# Patient Record
Sex: Female | Born: 1985 | Race: Black or African American | Hispanic: No | Marital: Married | Smoking: Current every day smoker
Health system: Southern US, Community
[De-identification: ages and names within clinical notes are randomized; demographics above are authoritative.]

## PROBLEM LIST (undated history)

## (undated) DIAGNOSIS — B999 Unspecified infectious disease: Secondary | ICD-10-CM

## (undated) DIAGNOSIS — G40309 Generalized idiopathic epilepsy and epileptic syndromes, not intractable, without status epilepticus: Secondary | ICD-10-CM

## (undated) DIAGNOSIS — N62 Hypertrophy of breast: Secondary | ICD-10-CM

## (undated) DIAGNOSIS — G43909 Migraine, unspecified, not intractable, without status migrainosus: Secondary | ICD-10-CM

## (undated) DIAGNOSIS — R51 Headache: Secondary | ICD-10-CM

## (undated) DIAGNOSIS — O009 Unspecified ectopic pregnancy without intrauterine pregnancy: Secondary | ICD-10-CM

## (undated) DIAGNOSIS — G40409 Other generalized epilepsy and epileptic syndromes, not intractable, without status epilepticus: Secondary | ICD-10-CM

## (undated) DIAGNOSIS — B86 Scabies: Secondary | ICD-10-CM

## (undated) HISTORY — PX: THERAPEUTIC ABORTION: SHX798

---
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
## 2004-05-13 ENCOUNTER — Emergency Department (HOSPITAL_COMMUNITY): Admission: EM | Admit: 2004-05-13 | Discharge: 2004-05-14 | Payer: Self-pay | Admitting: Emergency Medicine

## 2005-12-24 ENCOUNTER — Emergency Department (HOSPITAL_COMMUNITY): Admission: EM | Admit: 2005-12-24 | Discharge: 2005-12-24 | Payer: Self-pay | Admitting: Emergency Medicine

## 2006-08-05 ENCOUNTER — Emergency Department (HOSPITAL_COMMUNITY): Admission: EM | Admit: 2006-08-05 | Discharge: 2006-08-05 | Payer: Self-pay | Admitting: Emergency Medicine

## 2006-09-09 ENCOUNTER — Emergency Department (HOSPITAL_COMMUNITY): Admission: EM | Admit: 2006-09-09 | Discharge: 2006-09-09 | Payer: Self-pay | Admitting: Emergency Medicine

## 2006-12-11 ENCOUNTER — Emergency Department (HOSPITAL_COMMUNITY): Admission: EM | Admit: 2006-12-11 | Discharge: 2006-12-11 | Payer: Self-pay | Admitting: Emergency Medicine

## 2006-12-26 ENCOUNTER — Emergency Department (HOSPITAL_COMMUNITY): Admission: EM | Admit: 2006-12-26 | Discharge: 2006-12-26 | Payer: Self-pay | Admitting: Emergency Medicine

## 2007-11-25 ENCOUNTER — Emergency Department (HOSPITAL_COMMUNITY): Admission: EM | Admit: 2007-11-25 | Discharge: 2007-11-25 | Payer: Self-pay | Admitting: Emergency Medicine

## 2008-01-02 ENCOUNTER — Emergency Department (HOSPITAL_COMMUNITY): Admission: EM | Admit: 2008-01-02 | Discharge: 2008-01-02 | Payer: Self-pay | Admitting: Emergency Medicine

## 2008-04-06 ENCOUNTER — Emergency Department (HOSPITAL_COMMUNITY): Admission: EM | Admit: 2008-04-06 | Discharge: 2008-04-06 | Payer: Self-pay | Admitting: Emergency Medicine

## 2008-05-27 ENCOUNTER — Emergency Department (HOSPITAL_COMMUNITY): Admission: EM | Admit: 2008-05-27 | Discharge: 2008-05-27 | Payer: Self-pay | Admitting: Emergency Medicine

## 2008-07-26 ENCOUNTER — Emergency Department (HOSPITAL_COMMUNITY): Admission: EM | Admit: 2008-07-26 | Discharge: 2008-07-26 | Payer: Self-pay | Admitting: Emergency Medicine

## 2008-07-29 ENCOUNTER — Other Ambulatory Visit: Admission: RE | Admit: 2008-07-29 | Discharge: 2008-07-29 | Payer: Self-pay | Admitting: Obstetrics & Gynecology

## 2008-08-08 ENCOUNTER — Emergency Department (HOSPITAL_COMMUNITY): Admission: EM | Admit: 2008-08-08 | Discharge: 2008-08-08 | Payer: Self-pay | Admitting: Emergency Medicine

## 2008-10-07 ENCOUNTER — Emergency Department (HOSPITAL_COMMUNITY): Admission: EM | Admit: 2008-10-07 | Discharge: 2008-10-07 | Payer: Self-pay | Admitting: Emergency Medicine

## 2009-03-05 ENCOUNTER — Emergency Department (HOSPITAL_COMMUNITY): Admission: EM | Admit: 2009-03-05 | Discharge: 2009-03-05 | Payer: Self-pay | Admitting: Emergency Medicine

## 2009-04-01 ENCOUNTER — Emergency Department (HOSPITAL_COMMUNITY): Admission: EM | Admit: 2009-04-01 | Discharge: 2009-04-01 | Payer: Self-pay | Admitting: Emergency Medicine

## 2009-07-29 ENCOUNTER — Emergency Department (HOSPITAL_COMMUNITY): Admission: EM | Admit: 2009-07-29 | Discharge: 2009-07-30 | Payer: Self-pay | Admitting: Emergency Medicine

## 2009-08-19 ENCOUNTER — Other Ambulatory Visit: Admission: RE | Admit: 2009-08-19 | Discharge: 2009-08-19 | Payer: Self-pay | Admitting: Obstetrics & Gynecology

## 2009-09-04 ENCOUNTER — Emergency Department (HOSPITAL_COMMUNITY): Admission: EM | Admit: 2009-09-04 | Discharge: 2009-09-04 | Payer: Self-pay | Admitting: Emergency Medicine

## 2009-09-18 ENCOUNTER — Ambulatory Visit: Admission: RE | Admit: 2009-09-18 | Discharge: 2009-09-18 | Payer: Self-pay | Admitting: Neurology

## 2009-11-22 ENCOUNTER — Emergency Department (HOSPITAL_COMMUNITY): Admission: EM | Admit: 2009-11-22 | Discharge: 2009-11-22 | Payer: Self-pay | Admitting: Emergency Medicine

## 2010-08-04 ENCOUNTER — Emergency Department (HOSPITAL_COMMUNITY)
Admission: EM | Admit: 2010-08-04 | Discharge: 2010-08-04 | Disposition: A | Discharge status: Home / Self Care | Payer: Self-pay | Attending: Emergency Medicine | Admitting: Emergency Medicine

## 2010-08-04 DIAGNOSIS — B86 Scabies: Secondary | ICD-10-CM | POA: Insufficient documentation

## 2010-08-25 LAB — URINALYSIS, ROUTINE W REFLEX MICROSCOPIC
Glucose, UA: NEGATIVE mg/dL
Nitrite: NEGATIVE
Specific Gravity, Urine: 1.025 (ref 1.005–1.030)
pH: 5.5 (ref 5.0–8.0)

## 2010-08-25 LAB — DIFFERENTIAL
Eosinophils Absolute: 0 10*3/uL (ref 0.0–0.7)
Eosinophils Relative: 0 % (ref 0–5)
Lymphocytes Relative: 10 % — ABNORMAL LOW (ref 12–46)
Lymphs Abs: 1.9 10*3/uL (ref 0.7–4.0)
Monocytes Absolute: 1.3 10*3/uL — ABNORMAL HIGH (ref 0.1–1.0)

## 2010-08-25 LAB — COMPREHENSIVE METABOLIC PANEL
AST: 16 U/L (ref 0–37)
Alkaline Phosphatase: 57 U/L (ref 39–117)
BUN: 8 mg/dL (ref 6–23)
CO2: 22 mEq/L (ref 19–32)
Calcium: 8.3 mg/dL — ABNORMAL LOW (ref 8.4–10.5)
Chloride: 108 mEq/L (ref 96–112)
Creatinine, Ser: 0.93 mg/dL (ref 0.4–1.2)
Potassium: 3.3 mEq/L — ABNORMAL LOW (ref 3.5–5.1)
Total Bilirubin: 0.5 mg/dL (ref 0.3–1.2)
Total Protein: 6.4 g/dL (ref 6.0–8.3)

## 2010-08-25 LAB — URINE MICROSCOPIC-ADD ON

## 2010-08-25 LAB — CBC
HCT: 33 % — ABNORMAL LOW (ref 36.0–46.0)
Hemoglobin: 11.8 g/dL — ABNORMAL LOW (ref 12.0–15.0)
RBC: 3.65 MIL/uL — ABNORMAL LOW (ref 3.87–5.11)

## 2010-08-25 LAB — PREGNANCY, URINE: Preg Test, Ur: NEGATIVE

## 2010-08-25 LAB — URINE CULTURE: Culture: NO GROWTH

## 2010-08-25 LAB — PHENYTOIN LEVEL, TOTAL: Phenytoin Lvl: <2.5 ug/mL — ABNORMAL LOW (ref 10.0–20.0)

## 2010-08-25 LAB — RAPID STREP SCREEN (MED CTR MEBANE ONLY): Streptococcus, Group A Screen (Direct): NEGATIVE

## 2010-09-09 LAB — BASIC METABOLIC PANEL
Chloride: 107 mEq/L (ref 96–112)
Creatinine, Ser: 1.13 mg/dL (ref 0.4–1.2)
GFR calc non Af Amer: 59 mL/min — ABNORMAL LOW (ref >60)
Glucose, Bld: 89 mg/dL (ref 70–99)
Potassium: 3.7 mEq/L (ref 3.5–5.1)
Sodium: 137 mEq/L (ref 135–145)

## 2010-09-09 LAB — PREGNANCY, URINE: Preg Test, Ur: NEGATIVE

## 2010-09-09 LAB — DIFFERENTIAL
Basophils Absolute: 0 10*3/uL (ref 0.0–0.1)
Basophils Relative: 0 % (ref 0–1)
Lymphs Abs: 2.3 10*3/uL (ref 0.7–4.0)
Monocytes Absolute: 0.7 10*3/uL (ref 0.1–1.0)
Neutro Abs: 11.8 10*3/uL — ABNORMAL HIGH (ref 1.7–7.7)
Neutrophils Relative %: 80 % — ABNORMAL HIGH (ref 43–77)

## 2010-09-09 LAB — CBC
HCT: 38.5 % (ref 36.0–46.0)
MCHC: 34.6 g/dL (ref 30.0–36.0)
MCV: 91.4 fL (ref 78.0–100.0)
Platelets: 257 10*3/uL (ref 150–400)
RDW: 13.5 % (ref 11.5–15.5)
WBC: 14.8 10*3/uL — ABNORMAL HIGH (ref 4.0–10.5)

## 2010-09-14 LAB — URINALYSIS, ROUTINE W REFLEX MICROSCOPIC
Bilirubin Urine: NEGATIVE
Ketones, ur: NEGATIVE mg/dL
Nitrite: NEGATIVE
Specific Gravity, Urine: 1.03 (ref 1.005–1.030)
pH: 7 (ref 5.0–8.0)

## 2010-09-14 LAB — DIFFERENTIAL
Basophils Relative: 0 % (ref 0–1)
Eosinophils Absolute: 0.2 10*3/uL (ref 0.0–0.7)
Eosinophils Relative: 1 % (ref 0–5)
Monocytes Absolute: 0.9 10*3/uL (ref 0.1–1.0)
Neutro Abs: 8.8 10*3/uL — ABNORMAL HIGH (ref 1.7–7.7)

## 2010-09-14 LAB — BASIC METABOLIC PANEL
CO2: 15 mEq/L — ABNORMAL LOW (ref 19–32)
Chloride: 106 mEq/L (ref 96–112)
Glucose, Bld: 101 mg/dL — ABNORMAL HIGH (ref 70–99)
Potassium: 3.4 mEq/L — ABNORMAL LOW (ref 3.5–5.1)
Sodium: 136 mEq/L (ref 135–145)

## 2010-09-14 LAB — RAPID URINE DRUG SCREEN, HOSP PERFORMED
Amphetamines: NOT DETECTED
Barbiturates: NOT DETECTED
Benzodiazepines: NOT DETECTED
Cocaine: NOT DETECTED

## 2010-09-14 LAB — CBC
HCT: 38.3 % (ref 36.0–46.0)
Hemoglobin: 13.1 g/dL (ref 12.0–15.0)
MCHC: 34.3 g/dL (ref 30.0–36.0)
MCV: 93 fL (ref 78.0–100.0)
RDW: 13.5 % (ref 11.5–15.5)

## 2010-09-21 LAB — URINALYSIS, ROUTINE W REFLEX MICROSCOPIC
Bilirubin Urine: NEGATIVE
Nitrite: NEGATIVE
Specific Gravity, Urine: >1.03 — ABNORMAL HIGH (ref 1.005–1.030)
pH: 5 (ref 5.0–8.0)

## 2010-09-21 LAB — URINE MICROSCOPIC-ADD ON

## 2010-09-21 LAB — GC/CHLAMYDIA PROBE AMP, GENITAL: GC Probe Amp, Genital: NEGATIVE

## 2010-09-21 LAB — WET PREP, GENITAL

## 2010-09-21 LAB — RPR: RPR Ser Ql: NONREACTIVE

## 2010-10-22 NOTE — Op Note (Signed)
NAMECHARNETTE, YOUNKIN                        ACCOUNT NO.:  [id number]   MEDICAL RECORD NO.:  [id number]                   PATIENT TYPE:  INP   LOCATION:  A415                                 FACILITY:  [facility]   PHYSICIAN:  Lazaro Arms, M.D.                DATE OF BIRTH:  July 15, 1985   DATE OF PROCEDURE:  DATE OF DISCHARGE:  05/08/2003                                 OPERATIVE REPORT   PROCEDURE:  Epidural placement.   INDICATIONS FOR PROCEDURE:  Chantille is an 25 year old female, gravida 1,  para 0 who was in the active phase of labor on 05/06/2003.  She was  requesting an epidural.   DESCRIPTION OF PROCEDURE:  The patient was placed in the sitting position.  The iliac crests were identified.  The L2-L3 interspace was identified,  prepped and draped.  Lidocaine 1% was injected subcutaneously as a local  anesthetic.  A 17-gauge Tuohy needle was used an loss of resistance  technique employed.  With one pass, the epidural space was found without  difficulty.  A test dose of 10 cc of 0.125% bupivacaine plain was given  without ill effects.  The epidural catheter was then fed 5 cm into the  epidural space.  An additional 10 cc of 0.125% bupivacaine plain with 5 cc  of 1.5% lidocaine with 1:200,000 epinephrine was given through the epidural  catheter, again without ill effects.  It was taped down at the skin 5 cm  into the epidural space and anchored without difficulty.  The patient's  blood pressure was stable, the fetal heart rate tracing was stable, and the  patient was getting good relief.      ___________________________________________                                            Lazaro Arms, M.D.   LHE/MEDQ  D:  05/27/2003  T:  05/27/2003  Job:  [id number]

## 2010-10-22 NOTE — H&P (Signed)
Katherine Simmons, [name]                        ACCOUNT NO.:  [id number]   MEDICAL RECORD NO.:  [id number]                  PATIENT TYPE:   LOCATION:                                       FACILITY:   PHYSICIAN:  Lazaro Arms, M.D.                DATE OF BIRTH:   DATE OF ADMISSION:  05/06/2003  DATE OF DISCHARGE:                                HISTORY & PHYSICAL   CHIEF COMPLAINT:  Intrauterine pregnancy at 54 weeks' gestation with  borderline reactive non-stress test for induction of labor.   HISTORY OF PRESENT ILLNESS:  Katherine Simmons is an 25 year old, G2, P0, AB1 with an  EDC of May 06, 2003, based on last menstrual period and correlating  first and second trimester ultrasound placing her at 26 weeks' gestation.  She began prenatal care early in the first trimester and has had regular  visits throughout.   PRENATAL COURSE:  Prenatal care has been uneventful.   PRENATAL LABORATORY DATA:  Blood type O positive, rubella immune.  HB, HIV,  RPR, gonorrhea and Chlamydia were all negative.  Group B Streptococcus was  positive.  One-hour GTT was normal at 107.  Sickle cell screen was negative.  MSAFP was normal.  Blood pressures have been 100-120/50-80.  Total weight  gain has been 21 pounds with appropriate fundal height growth.   PAST MEDICAL HISTORY:  Noncontributory.   PAST SURGICAL HISTORY:  Noncontributory.   ALLERGIES:  No known drug allergies.   SOCIAL HISTORY:  Single and lives with parents.  Father of the baby is  supported.   FAMILY HISTORY:  No contributing family history.   PAST OBSTETRICAL HISTORY:  Positive for elective AB in 2001.   PHYSICAL EXAMINATION:  HEENT:  Within normal limits.  HEART:  Regular rate and rhythm.  LUNGS:  Clear.  ABDOMEN:  Soft, nontender with mild, irregular contractions at this time.  Fetal heart rate is in the 130s with average long-term variability.  She  does have some qualifying 15 x 15 accelerations; however, most of the strip  is  without qualifying accelerations.  There has been one variable  deceleration.  PELVIC:  Artificial rupture of membranes reveal light meconium stained  fluid.  Cervix is 2-3 cm, 80% effaced, -1 station, vertex presentation.  EXTREMITIES:  Legs are negative.   IMPRESSION:  1. Intrauterine pregnancy at 40 weeks.  2. Borderline reactive nonstress test.   PLAN:  1. Admit for induction of labor.  GBS carrier.  2. Pitocin augmentation.  3. GBS prophylaxis.  4. The patient does plan an epidural.     _____________________________________  ___________________________________________  Katherine Simmons, C.N.M.        Lazaro Arms, M.D.   FC/MEDQ  D:  05/06/2003  T:  05/06/2003  Job:  [id number]   cc:   [facility]

## 2010-12-12 ENCOUNTER — Emergency Department (HOSPITAL_COMMUNITY)
Admission: EM | Admit: 2010-12-12 | Discharge: 2010-12-12 | Disposition: A | Discharge status: Home / Self Care | Payer: Medicaid Other | Attending: Emergency Medicine | Admitting: Emergency Medicine

## 2010-12-12 ENCOUNTER — Encounter: Payer: Self-pay | Admitting: *Deleted

## 2010-12-12 DIAGNOSIS — B86 Scabies: Secondary | ICD-10-CM | POA: Insufficient documentation

## 2010-12-12 DIAGNOSIS — L259 Unspecified contact dermatitis, unspecified cause: Secondary | ICD-10-CM | POA: Insufficient documentation

## 2010-12-12 DIAGNOSIS — F17200 Nicotine dependence, unspecified, uncomplicated: Secondary | ICD-10-CM | POA: Insufficient documentation

## 2010-12-12 DIAGNOSIS — R569 Unspecified convulsions: Secondary | ICD-10-CM | POA: Insufficient documentation

## 2010-12-12 HISTORY — DX: Scabies: B86

## 2010-12-12 MED ORDER — TRIAMCINOLONE ACETONIDE 0.1 % EX CREA
TOPICAL_CREAM | Freq: Three times a day (TID) | CUTANEOUS | Status: AC
Start: 2010-12-12 — End: 2011-12-12

## 2010-12-12 NOTE — ED Notes (Signed)
Rash to right side of face, right wrist and bil hands x 3 days ago.  C/o itching and burning with bathing.  Has been dx with Scabies in March of 2012 and used left over cream with no relief.

## 2010-12-12 NOTE — ED Provider Notes (Signed)
History     Chief Complaint  Patient presents with  . Rash    to right side of face, bil arms, bil hands   Patient is a 25 y.o. female presenting with rash. The history is provided by the patient.  Rash  This is a new problem. The current episode started more than 2 days ago. The problem has not changed since onset.The problem is associated with nothing. There has been no fever. The rash is present on the right wrist, right hand, left hand and face. The patient is experiencing no pain. The pain has been constant since onset. Associated symptoms include itching. Pertinent negatives include no blisters, no pain and no weeping. She has tried nothing for the symptoms.    Past Medical History  Diagnosis Date  . Scabies   . Seizure     History reviewed. No pertinent past surgical history.  Family History  Problem Relation Age of Onset  . Hypertension Mother   . Cancer Mother     History  Substance Use Topics  . Smoking status: Current Everyday Smoker -- 0.5 packs/day    Types: Cigarettes  . Smokeless tobacco: Not on file  . Alcohol Use: Yes     occasional    OB History    Grav Para Term Preterm Abortions TAB SAB Ect Mult Living   2 1              Review of Systems  Constitutional: Negative for fever and chills.  HENT: Negative for ear pain, congestion, trouble swallowing and neck pain.   Respiratory: Negative.   Cardiovascular: Negative.   Gastrointestinal: Negative for nausea, vomiting and abdominal pain.  Genitourinary: Negative for dysuria.  Musculoskeletal: Negative.   Skin: Positive for itching and rash. Negative for color change.  Neurological: Negative.   Hematological: Does not bruise/bleed easily.    Physical Exam  BP 105/73  Pulse 86  Temp(Src) 98.3 F (36.8 C) (Oral)  Resp 20  Ht 5\' 6"  (1.676 m)  Wt 260 lb (117.935 kg)  BMI 41.97 kg/m2  SpO2 100%  LMP 11/28/2010  Physical Exam  Constitutional: She is oriented to person, place, and time. Vital  signs are normal. She appears well-developed and well-nourished.  Non-toxic appearance.  HENT:  Head: Normocephalic and atraumatic.  Eyes: Conjunctivae are normal. Pupils are equal, round, and reactive to light.  Cardiovascular: Normal rate, regular rhythm and normal heart sounds.   Pulmonary/Chest: Effort normal and breath sounds normal.  Abdominal: Soft.  Musculoskeletal: Normal range of motion.  Lymphadenopathy:    She has no cervical adenopathy.  Neurological: She is alert and oriented to person, place, and time.  Skin: Skin is warm and dry. Rash noted. No purpura noted. Rash is macular and papular. Rash is not vesicular and not urticarial.    ED Course  Procedures  MDM  Patient is alert, NAD.  Vitals stable      Tammy L. Trisha Mangle, [place] 12/12/10 1248  Evaluation and management were performed by NP/PA under my supervision/collaboration.  SHELDON,CHARLES B.   Charles B. Bernette Mayers, MD 12/14/10 [phone]

## 2011-03-04 LAB — RAPID URINE DRUG SCREEN, HOSP PERFORMED
Amphetamines: NOT DETECTED
Barbiturates: NOT DETECTED
Benzodiazepines: NOT DETECTED
Cocaine: NOT DETECTED
Opiates: NOT DETECTED
Tetrahydrocannabinol: NOT DETECTED

## 2011-03-04 LAB — URINALYSIS, ROUTINE W REFLEX MICROSCOPIC
Bilirubin Urine: NEGATIVE
Hgb urine dipstick: NEGATIVE
Ketones, ur: NEGATIVE
Nitrite: NEGATIVE
Protein, ur: NEGATIVE
Specific Gravity, Urine: >1.03 — ABNORMAL HIGH
Urobilinogen, UA: 0.2

## 2011-03-04 LAB — DIFFERENTIAL
Eosinophils Absolute: 0
Lymphocytes Relative: 31
Lymphs Abs: 4.5 — ABNORMAL HIGH
Monocytes Relative: 6
Neutro Abs: 9 — ABNORMAL HIGH
Neutrophils Relative %: 62

## 2011-03-04 LAB — ETHANOL: Alcohol, Ethyl (B): <5

## 2011-03-04 LAB — POCT I-STAT, CHEM 8
Calcium, Ion: 1.21
Creatinine, Ser: 1
Glucose, Bld: 76
HCT: 39
Hemoglobin: 13.3
Potassium: 3.8

## 2011-03-04 LAB — CBC
MCV: 91.7
Platelets: 267
RBC: 4.12
WBC: 14.5 — ABNORMAL HIGH

## 2011-03-21 LAB — URINALYSIS, ROUTINE W REFLEX MICROSCOPIC
Bilirubin Urine: NEGATIVE
Ketones, ur: NEGATIVE
Specific Gravity, Urine: >1.03 — ABNORMAL HIGH
Urobilinogen, UA: 0.2

## 2011-03-21 LAB — URINE MICROSCOPIC-ADD ON

## 2011-03-21 LAB — URINE CULTURE
Colony Count: NO GROWTH
Culture: NO GROWTH

## 2011-03-22 LAB — URINE MICROSCOPIC-ADD ON

## 2011-03-22 LAB — URINALYSIS, ROUTINE W REFLEX MICROSCOPIC
Bilirubin Urine: NEGATIVE
Glucose, UA: NEGATIVE
Specific Gravity, Urine: >1.03 — ABNORMAL HIGH

## 2011-03-22 LAB — GC/CHLAMYDIA PROBE AMP, GENITAL: GC Probe Amp, Genital: NEGATIVE

## 2011-06-07 HISTORY — PX: LAPAROSCOPIC UNILATERAL SALPINGECTOMY: SHX5934

## 2011-12-28 IMAGING — CT CT HEAD W/O CM
3 of 6 series · 12 of 47 positions shown, 14 images · non-contrast
Comparison: CT head 10/07/2008.  MRI brain 01/02/2008.

CT HEAD

CLINICAL DATA: Seizure.  Hit posterior head and counter.  Pain.

CT HEAD WITHOUT CONTRAST
CT CERVICAL SPINE WITHOUT CONTRAST
TECHNIQUE: Multidetector CT imaging of the head and cervical spine
was performed following the standard protocol without intravenous
contrast.  Multiplanar CT image reconstructions of the cervical
spine were also generated.

[Series 3: headseq 2.4 h60s · axial · 0.47mm/px · z∈[+201,+330]mm · 6 of 72 slices shown, 8 images]
[im 11/72  brain]
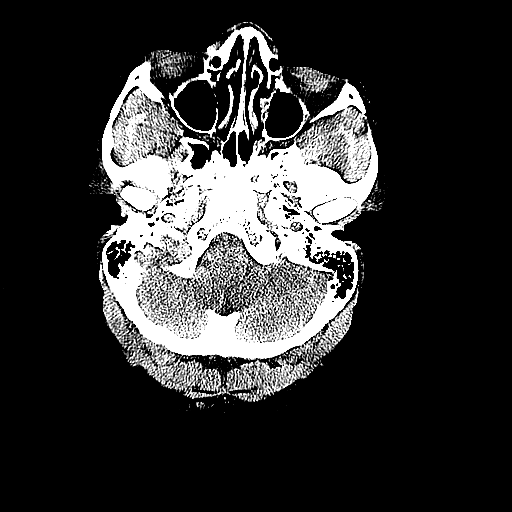
[im 11/72  bone]
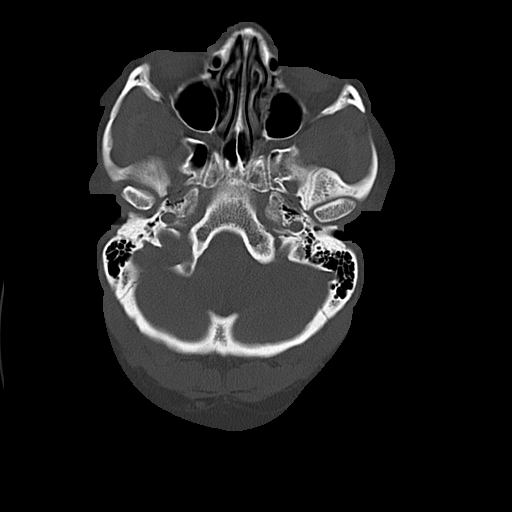
[im 21/72  brain]
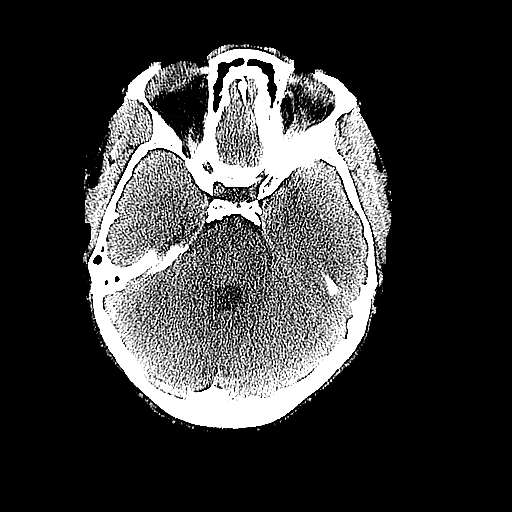
[im 31/72  brain]
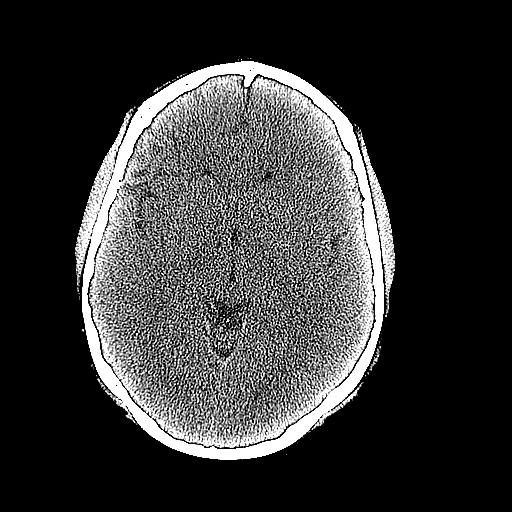
[im 41/72  brain]
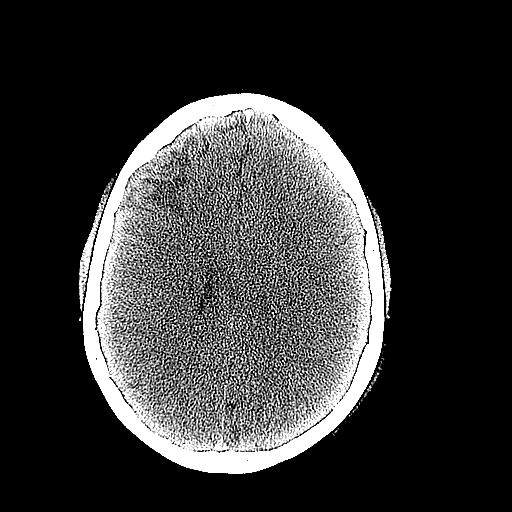
[im 51/72  brain]
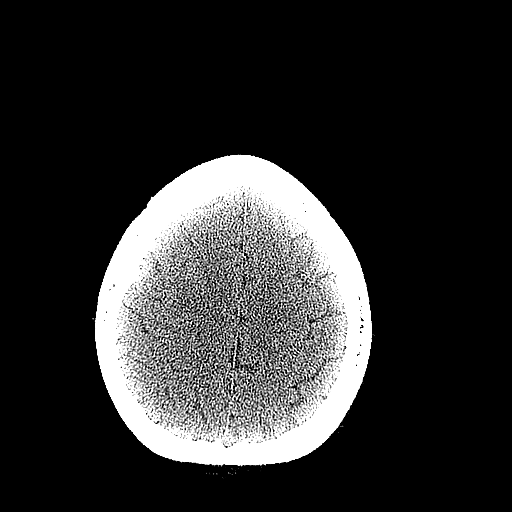
[im 51/72  bone]
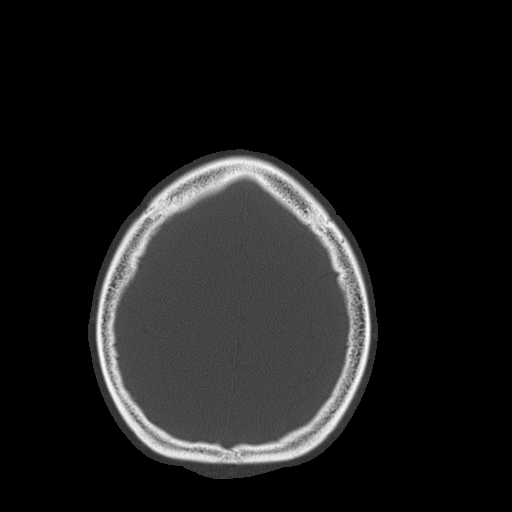
[im 61/72  brain]
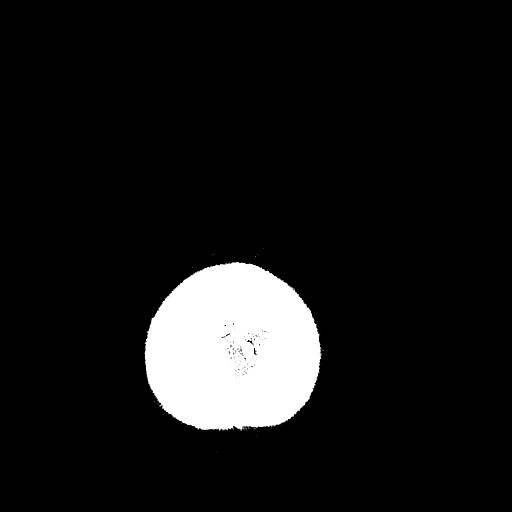

[Series 7: cervical coro (id) · coronal · 0.14mm/px · 3 of 35 slices shown]
[im 12/35  brain]
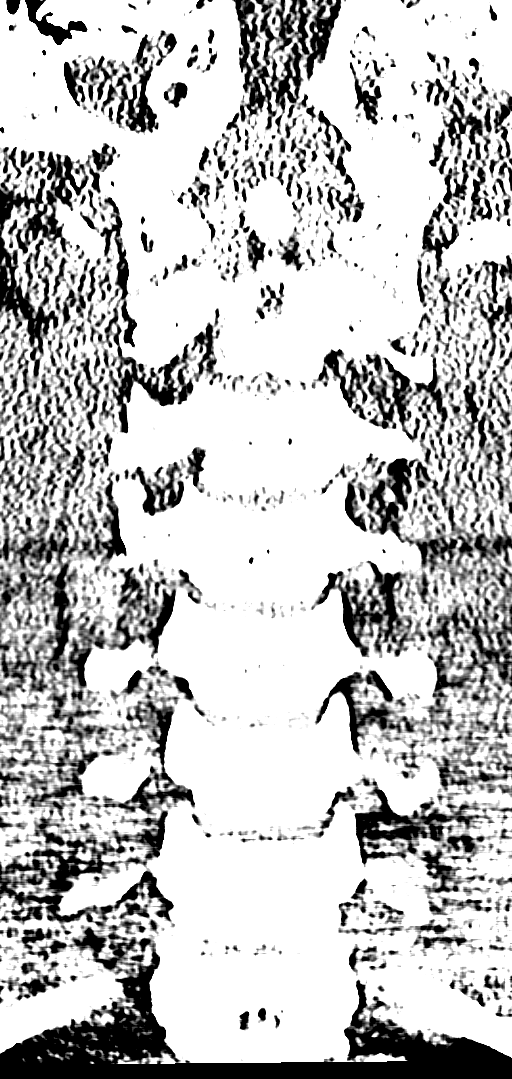
[im 16/35  brain]
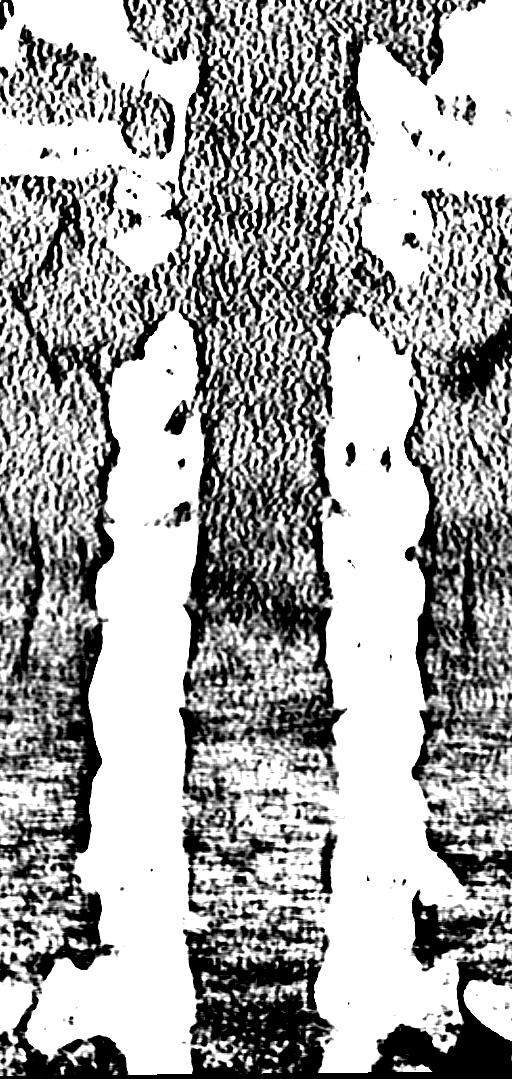
[im 19/35  brain]
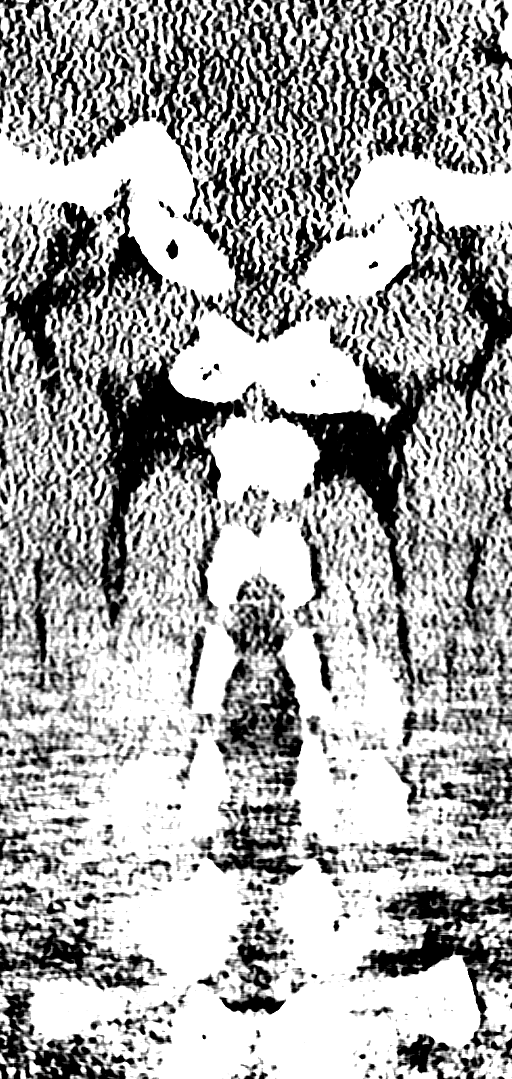

[Series 8: cervical sag (id) · sagittal · 0.17mm/px · 3 of 37 slices shown]
[im 13/37  brain]
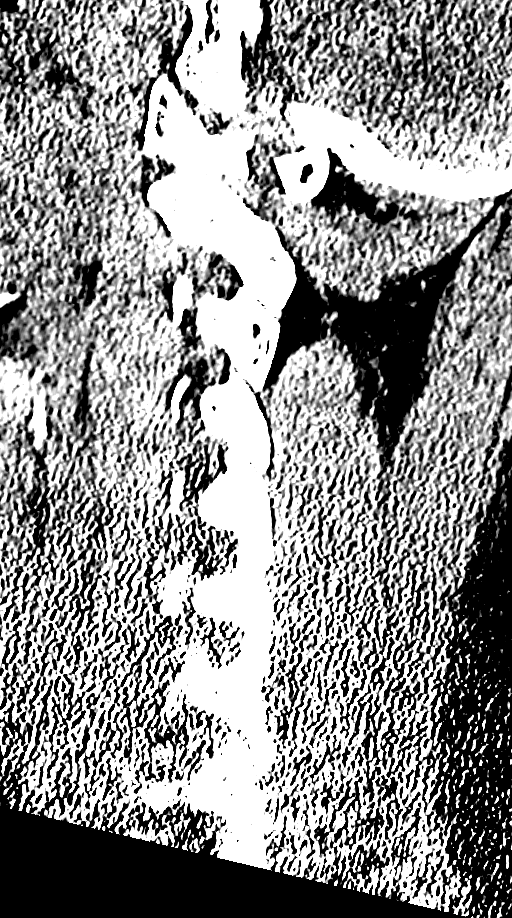
[im 19/37  brain]
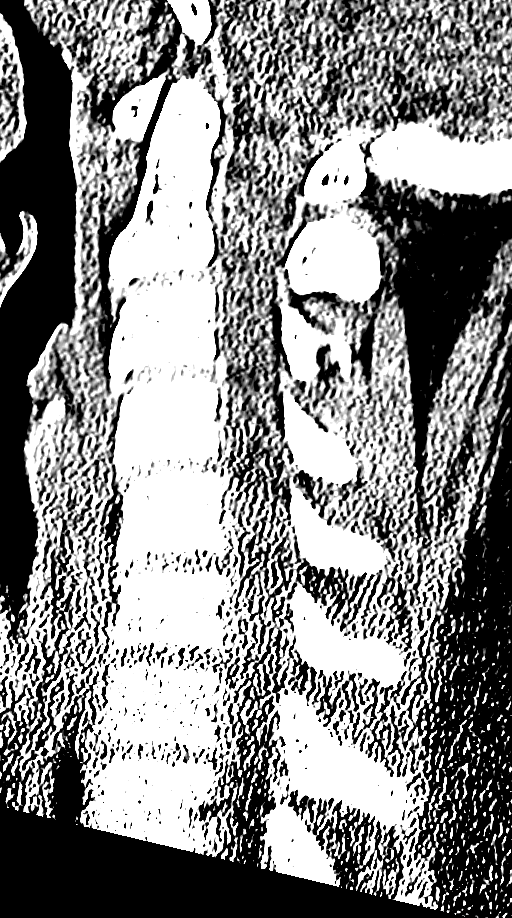
[im 25/37  brain]
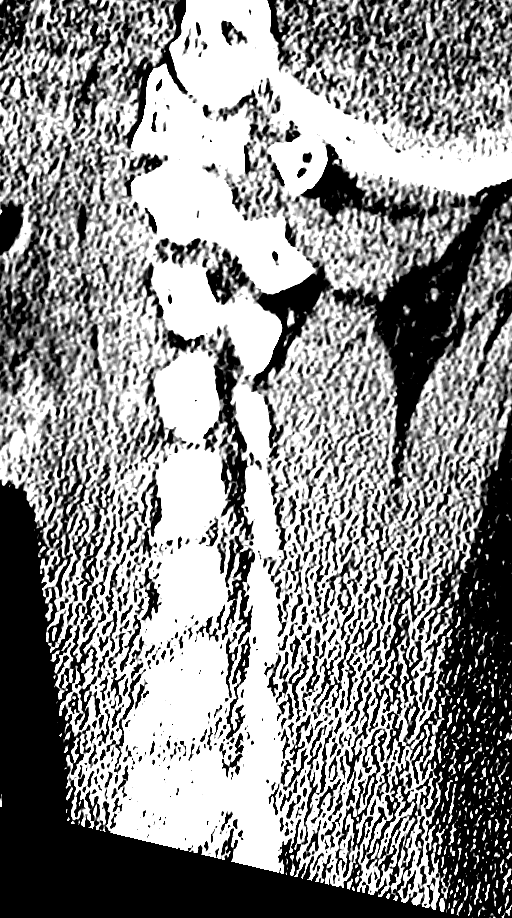

[12 of 47 positions shown; findings below may reference images not displayed]

FINDINGS: A focal area of encephalomalacia is again seen in the
right frontal lobe, extending to the frontal horn of the right
lateral ventricle.  The no acute cortical infarct, hemorrhage,
mass, hydrocephalus, or significant extra-axial fluid collection is
present.

The paranasal sinuses and mastoid air cells are clear.  The osseous
skull is intact.
IMPRESSION: 1.  Stable encephalomalacia/gliosis of the right frontal lobe.
2.  No acute intracranial abnormality.

CT CERVICAL SPINE
FINDINGS: The cervical spine is imaged from skull base through C7-
T1.  The vertebral body heights and alignment are maintained.
There is straightening of the normal cervical lordosis.  No acute
fracture or traumatic subluxation is evident.  The soft tissues are
unremarkable.  The lung apices are not imaged.
IMPRESSION: No acute fracture or traumatic subluxation.

## 2012-02-17 ENCOUNTER — Encounter (HOSPITAL_COMMUNITY): Payer: Self-pay | Admitting: Anesthesiology

## 2012-02-17 ENCOUNTER — Ambulatory Visit (HOSPITAL_COMMUNITY): Payer: Medicaid Other | Admitting: Anesthesiology

## 2012-02-17 ENCOUNTER — Ambulatory Visit (HOSPITAL_COMMUNITY)
Admission: RE | Admit: 2012-02-17 | Discharge: 2012-02-17 | Disposition: A | Discharge status: Home / Self Care | Payer: Medicaid Other | Source: Ambulatory Visit | Attending: Obstetrics and Gynecology | Admitting: Obstetrics and Gynecology

## 2012-02-17 ENCOUNTER — Encounter: Payer: Self-pay | Admitting: Obstetrics and Gynecology

## 2012-02-17 ENCOUNTER — Encounter (HOSPITAL_COMMUNITY)
Admission: RE | Disposition: A | Discharge status: Home / Self Care | Payer: Self-pay | Source: Ambulatory Visit | Attending: Obstetrics and Gynecology

## 2012-02-17 DIAGNOSIS — Z8759 Personal history of other complications of pregnancy, childbirth and the puerperium: Secondary | ICD-10-CM

## 2012-02-17 DIAGNOSIS — O00109 Unspecified tubal pregnancy without intrauterine pregnancy: Secondary | ICD-10-CM | POA: Diagnosis present

## 2012-02-17 SURGERY — SALPINGECTOMY, UNILATERAL, LAPAROSCOPIC
Anesthesia: General | Laterality: Right | Wound class: Clean

## 2012-02-17 MED ORDER — GLYCOPYRROLATE 0.2 MG/ML IJ SOLN
INTRAMUSCULAR | Status: AC
  Filled 2012-02-17: qty 3

## 2012-02-17 MED ORDER — SUCCINYLCHOLINE CHLORIDE 20 MG/ML IJ SOLN
INTRAMUSCULAR | Status: AC
  Filled 2012-02-17: qty 1

## 2012-02-17 MED ORDER — FENTANYL CITRATE 0.05 MG/ML IJ SOLN
INTRAMUSCULAR | Status: AC
  Filled 2012-02-17: qty 5

## 2012-02-17 MED ORDER — FENTANYL CITRATE 0.05 MG/ML IJ SOLN
INTRAMUSCULAR | Status: DC | PRN
  Administered 2012-02-17 (×7): 50 ug via INTRAVENOUS

## 2012-02-17 MED ORDER — SUCCINYLCHOLINE CHLORIDE 20 MG/ML IJ SOLN
INTRAMUSCULAR | Status: DC | PRN
  Administered 2012-02-17: 120 mg via INTRAVENOUS

## 2012-02-17 MED ORDER — ONDANSETRON HCL 4 MG/2ML IJ SOLN
INTRAMUSCULAR | Status: AC
  Filled 2012-02-17: qty 2

## 2012-02-17 MED ORDER — FENTANYL CITRATE 0.05 MG/ML IJ SOLN
INTRAMUSCULAR | Status: AC
  Filled 2012-02-17: qty 2

## 2012-02-17 MED ORDER — OXYCODONE-ACETAMINOPHEN 10-325 MG PO TABS: 1.0000 | ORAL_TABLET | ORAL | Status: AC | PRN

## 2012-02-17 MED ORDER — CEFAZOLIN SODIUM-DEXTROSE 2-3 GM-% IV SOLR: 2.0000 g | INTRAVENOUS | Status: DC

## 2012-02-17 MED ORDER — LIDOCAINE HCL 1 % IJ SOLN
INTRAMUSCULAR | Status: DC | PRN
  Administered 2012-02-17: 40 mg via INTRADERMAL

## 2012-02-17 MED ORDER — CEFAZOLIN SODIUM 1-5 GM-% IV SOLN
1.0000 g | INTRAVENOUS | Status: AC
  Administered 2012-02-17: 1 g via INTRAVENOUS

## 2012-02-17 MED ORDER — LACTATED RINGERS IV SOLN
INTRAVENOUS | Status: DC
  Administered 2012-02-17 (×2): via INTRAVENOUS

## 2012-02-17 MED ORDER — ROCURONIUM BROMIDE 100 MG/10ML IV SOLN
INTRAVENOUS | Status: DC | PRN
  Administered 2012-02-17: 25 mg via INTRAVENOUS
  Administered 2012-02-17: 5 mg via INTRAVENOUS

## 2012-02-17 MED ORDER — SODIUM CHLORIDE 0.9 % IR SOLN
Status: DC | PRN
  Administered 2012-02-17: 1000 mL

## 2012-02-17 MED ORDER — MIDAZOLAM HCL 5 MG/5ML IJ SOLN
INTRAMUSCULAR | Status: DC | PRN
  Administered 2012-02-17 (×2): 2 mg via INTRAVENOUS

## 2012-02-17 MED ORDER — MIDAZOLAM HCL 2 MG/2ML IJ SOLN
INTRAMUSCULAR | Status: AC
  Filled 2012-02-17: qty 2

## 2012-02-17 MED ORDER — CEFAZOLIN SODIUM 1-5 GM-% IV SOLN
INTRAVENOUS | Status: AC
  Filled 2012-02-17: qty 150

## 2012-02-17 MED ORDER — PROPOFOL 10 MG/ML IV BOLUS
INTRAVENOUS | Status: DC | PRN
  Administered 2012-02-17: 175 mg via INTRAVENOUS

## 2012-02-17 MED ORDER — GLYCOPYRROLATE 0.2 MG/ML IJ SOLN
INTRAMUSCULAR | Status: DC | PRN
  Administered 2012-02-17: .6 mg via INTRAVENOUS

## 2012-02-17 MED ORDER — FENTANYL CITRATE 0.05 MG/ML IJ SOLN
25.0000 ug | INTRAMUSCULAR | Status: DC | PRN
  Administered 2012-02-17 (×2): 50 ug via INTRAVENOUS

## 2012-02-17 MED ORDER — PROPOFOL 10 MG/ML IV EMUL
INTRAVENOUS | Status: AC
  Filled 2012-02-17: qty 20

## 2012-02-17 MED ORDER — NEOSTIGMINE METHYLSULFATE 1 MG/ML IJ SOLN
INTRAMUSCULAR | Status: DC | PRN
  Administered 2012-02-17: 3 mg via INTRAVENOUS

## 2012-02-17 MED ORDER — LIDOCAINE HCL (PF) 1 % IJ SOLN
INTRAMUSCULAR | Status: AC
  Filled 2012-02-17: qty 5

## 2012-02-17 MED ORDER — ROCURONIUM BROMIDE 50 MG/5ML IV SOLN
INTRAVENOUS | Status: AC
  Filled 2012-02-17: qty 1

## 2012-02-17 MED ORDER — DEXTROSE 5 % IV SOLN: 3.0000 g | INTRAVENOUS | Status: DC

## 2012-02-17 MED ORDER — ONDANSETRON HCL 4 MG/2ML IJ SOLN
INTRAMUSCULAR | Status: DC | PRN
  Administered 2012-02-17 (×2): 4 mg via INTRAVENOUS

## 2012-02-17 MED ORDER — SODIUM CHLORIDE 0.9 % IV SOLN: INTRAVENOUS | Status: DC

## 2012-02-17 SURGICAL SUPPLY — 56 items
ADH SKN CLS APL DERMABOND .7 (GAUZE/BANDAGES/DRESSINGS)
APL SKNCLS STERI-STRIP NONHPOA (GAUZE/BANDAGES/DRESSINGS) ×2
BAG HAMPER (MISCELLANEOUS) ×3 IMPLANT
BAG SPEC RTRVL LRG 6X4 10 (ENDOMECHANICALS) ×2
BENZOIN TINCTURE PRP APPL 2/3 (GAUZE/BANDAGES/DRESSINGS) ×1 IMPLANT
BLADE SURG SZ11 CARB STEEL (BLADE) ×4 IMPLANT
CLOTH BEACON ORANGE TIMEOUT ST (SAFETY) ×3 IMPLANT
COVER LIGHT HANDLE STERIS (MISCELLANEOUS) ×6 IMPLANT
DERMABOND ADVANCED (GAUZE/BANDAGES/DRESSINGS)
DERMABOND ADVANCED .7 DNX12 (GAUZE/BANDAGES/DRESSINGS) ×2 IMPLANT
DRESSING COVERLET 3X1 FLEXIBLE (GAUZE/BANDAGES/DRESSINGS) ×9 IMPLANT
ELECT REM PT RETURN 9FT ADLT (ELECTROSURGICAL) ×3
ELECTRODE REM PT RTRN 9FT ADLT (ELECTROSURGICAL) ×2 IMPLANT
FILTER SMOKE EVAC LAPAROSHD (FILTER) ×3 IMPLANT
FORMALIN 10 PREFIL 120ML (MISCELLANEOUS) ×1 IMPLANT
FORMALIN 10 PREFIL 480ML (MISCELLANEOUS) ×4 IMPLANT
GAUZE SPONGE 4X4 16PLY XRAY LF (GAUZE/BANDAGES/DRESSINGS) ×3 IMPLANT
GLOVE ECLIPSE 6.5 STRL STRAW (GLOVE) ×1 IMPLANT
GLOVE ECLIPSE 9.0 STRL (GLOVE) ×3 IMPLANT
GLOVE INDICATOR 7.0 STRL GRN (GLOVE) ×3 IMPLANT
GLOVE INDICATOR STER SZ 9 (GLOVE) ×3 IMPLANT
GOWN STRL REIN 3XL LVL4 (GOWN DISPOSABLE) ×3 IMPLANT
GOWN STRL REIN XL XLG (GOWN DISPOSABLE) ×3 IMPLANT
INST SET LAPROSCOPIC GYN AP (KITS) ×3 IMPLANT
IV NS IRRIG 3000ML ARTHROMATIC (IV SOLUTION) ×2 IMPLANT
KIT ROOM TURNOVER APOR (KITS) ×3 IMPLANT
KIT TROCAR LAP GYN (TROCAR) ×3 IMPLANT
MANIFOLD NEPTUNE II (INSTRUMENTS) ×3 IMPLANT
NDL HYPO 25X1 1.5 SAFETY (NEEDLE) ×2 IMPLANT
NDL INSUFFLATION 14GA 120MM (NEEDLE) ×2 IMPLANT
NEEDLE HYPO 25X1 1.5 SAFETY (NEEDLE) ×3 IMPLANT
NEEDLE INSUFFLATION 14GA 120MM (NEEDLE) ×3 IMPLANT
NS IRRIG 1000ML POUR BTL (IV SOLUTION) ×3 IMPLANT
PACK PERI GYN (CUSTOM PROCEDURE TRAY) ×3 IMPLANT
PAD ARMBOARD 7.5X6 YLW CONV (MISCELLANEOUS) ×3 IMPLANT
POUCH SPECIMEN RETRIEVAL 10MM (ENDOMECHANICALS) ×1 IMPLANT
SCALPEL HARMONIC ACE (MISCELLANEOUS) IMPLANT
SCISSORS LAP 5X35 DISP (ENDOMECHANICALS) IMPLANT
SET BASIN LINEN APH (SET/KITS/TRAYS/PACK) ×3 IMPLANT
SET TUBE IRRIG SUCTION NO TIP (IRRIGATION / IRRIGATOR) ×2 IMPLANT
SOL PREP PROV IODINE SCRUB 4OZ (MISCELLANEOUS) ×3 IMPLANT
SOLUTION ANTI FOG 6CC (MISCELLANEOUS) ×3 IMPLANT
STRIP CLOSURE SKIN 1/4X3 (GAUZE/BANDAGES/DRESSINGS) ×3 IMPLANT
SUT VIC AB 4-0 PS2 27 (SUTURE) ×3 IMPLANT
SUT VICRYL 0 UR6 27IN ABS (SUTURE) ×3 IMPLANT
SUT VICRYL AB 3-0 FS1 BRD 27IN (SUTURE) ×3 IMPLANT
SYR BULB IRRIGATION 50ML (SYRINGE) ×3 IMPLANT
SYR CONTROL 10ML LL (SYRINGE) ×3 IMPLANT
SYRINGE 10CC LL (SYRINGE) ×3 IMPLANT
TOWEL OR 17X26 4PK STRL BLUE (TOWEL DISPOSABLE) ×2 IMPLANT
TRAY FOLEY CATH 16FR SILVER (SET/KITS/TRAYS/PACK) ×3 IMPLANT
TROCAR Z-THREAD SLEEVE 11X100 (TROCAR) ×3 IMPLANT
TUBING DYE INJECTION 900-617 (MISCELLANEOUS) IMPLANT
TUBING INSUFFLATION 10FT LAP (TUBING) ×3 IMPLANT
TUBING INSUFFLATION HIGH FLOW (TUBING) ×3 IMPLANT
WARMER LAPAROSCOPE (MISCELLANEOUS) ×3 IMPLANT

## 2012-02-17 NOTE — Op Note (Signed)
Procedure: Laparoscopic right salpingectomy, laparoscopic lysis of adhesions left adnexa (left tube and ovary) Details of procedure: Patient was taken operating room, prepped and draped for combined abdominal and vaginal procedure. Timeout was conducted, Ancef 3 g IV administered due to the elevated BMI. Hulka tenaculum was attached to the cervix for uterine manipulation, and Foley catheter was in place. An infraumbilical vertical 1 cm skin incision was made as well as a transverse suprapubic incision of similar length., And a right lower quadrant small 5 mm incision. Attention was then directed to the umbilicus where Veress needle was used to achieve pneumoperitoneum using water droplet technique to confirm intraperitoneal location. Pneumoperitoneum was achieved under 14 mm mercury pressure. The 10 mm trocar was inserted under direct visualization through the umbilicus and attention directed to the suprapubic and right lower quadrant sites for direct visualization of trocar insertion. Attention to the pelvis was performed. There was no evidence of trauma or bleeding associated with entry. The right tube was identified, grossly distended with very erythematous ovarian surfaces. The ectopic pregnancy was located midportion of the tube. Salpingectomy was decided upon as the procedure. The tube was elevated and using grasping device through the suprapubic site, and  the right lower quadrant trocar site was used for harmonic scalpel which was used to coagulate and transect the mesosalpinx and amputate the tube From the cornu of the uterus.specimen was placed in an Endo Catch bag placed through the 11 mm umbilical site, with visualization through the 5 mm trocar  was performed via the right lower quadrant site.The specimen was extracted through the umbilicus, camera was repositioned, pelvis inspected. The small amount of bleeding, was removed, 20 cc, was extracted from the pelvis and attention to the left adnexa  performed. There was extensive thin filmy layers of adhesions from the left  tube and ovary to adjacent structures. Harmonic scalpel was used to perform a adhesiolysis of the extensive adhesions. The resulting review is photo documented and shows a clearly visible fimbria on the left side as well as free of the surface of the ovary nicely. Some adhesions in the cul-de-sac were left in situ. There were adhesions in the suprapubic area that were transected as well. 120 cc was instilled in the abdomen of saline, x2, then deflation the abdomen was performed and the fascia closed at the umbilical port and subcuticular 4-0 Vicryl closure of all 3 trocar sites performed. Sponge and needle counts were correct procedure tolerated well the patient blood type bowel records is Rh+. She will go home after observation in recovery

## 2012-02-17 NOTE — Anesthesia Procedure Notes (Signed)
Procedure Name: Intubation Date/Time: 02/17/2012 3:29 PM Performed by: Glynn Octave E Pre-anesthesia Checklist: Patient identified, Patient being monitored, Timeout performed, Emergency Drugs available and Suction available Patient Re-evaluated:Patient Re-evaluated prior to inductionOxygen Delivery Method: Circle System Utilized Preoxygenation: Pre-oxygenation with 100% oxygen Intubation Type: IV induction, Rapid sequence and Cricoid Pressure applied Laryngoscope Size: Mac and 3 Grade View: Grade I Tube type: Oral Tube size: 7.0 mm Number of attempts: 1 Airway Equipment and Method: stylet Placement Confirmation: ETT inserted through vocal cords under direct vision,  positive ETCO2 and breath sounds checked- equal and bilateral Secured at: 21 cm Tube secured with: Tape Dental Injury: Teeth and Oropharynx as per pre-operative assessment

## 2012-02-17 NOTE — Anesthesia Postprocedure Evaluation (Signed)
  Anesthesia Post-op Note  Patient: Katherine Simmons  Procedure(s) Performed: Procedure(s) (LRB) with comments: LAPAROSCOPIC UNILATERAL SALPINGECTOMY (Right) - ectopic pregnancy LAPAROSCOPIC LYSIS OF ADHESIONS (N/A)  Patient Location: PACU  Anesthesia Type: General  Level of Consciousness: awake, alert  and oriented  Airway and Oxygen Therapy: Patient Spontanous Breathing and Patient connected to face mask oxygen  Post-op Pain: mild  Post-op Assessment: Post-op Vital signs reviewed, Patient's Cardiovascular Status Stable, Respiratory Function Stable, Patent Airway and No signs of Nausea or vomiting  Post-op Vital Signs: Reviewed and stable  Complications: No apparent anesthesia complications

## 2012-02-17 NOTE — Transfer of Care (Signed)
Immediate Anesthesia Transfer of Care Note  Patient: Katherine Simmons  Procedure(s) Performed: Procedure(s) (LRB) with comments: LAPAROSCOPIC UNILATERAL SALPINGECTOMY (Right) - ectopic pregnancy LAPAROSCOPIC LYSIS OF ADHESIONS (N/A)  Patient Location: PACU  Anesthesia Type: General  Level of Consciousness: awake, alert  and oriented  Airway & Oxygen Therapy: Patient Spontanous Breathing and Patient connected to face mask oxygen  Post-op Assessment: Report given to PACU RN  Post vital signs: Reviewed and stable  Complications: No apparent anesthesia complications

## 2012-02-17 NOTE — Anesthesia Preprocedure Evaluation (Addendum)
Anesthesia Evaluation  Patient identified by MRN, date of birth, ID band Patient awake    Reviewed: Allergy & Precautions, H&P , NPO status , Patient's Chart, lab work & pertinent test results  Airway Mallampati: I TM Distance: >3 FB     Dental  (+) Teeth Intact and Dental Advisory Given   Pulmonary shortness of breath and with exertion,  breath sounds clear to auscultation        Cardiovascular negative cardio ROS  Rhythm:Regular Rate:Normal     Neuro/Psych Seizures -, Well Controlled,  Last seizure in feb. 2011    GI/Hepatic negative GI ROS,   Endo/Other  Morbid obesity  Renal/GU      Musculoskeletal   Abdominal   Peds  Hematology   Anesthesia Other Findings   Reproductive/Obstetrics                          Anesthesia Physical Anesthesia Plan  ASA: II and Emergent  Anesthesia Plan: General   Post-op Pain Management:    Induction: Intravenous, Rapid sequence and Cricoid pressure planned  Airway Management Planned: Oral ETT  Additional Equipment:   Intra-op Plan:   Post-operative Plan: Extubation in OR  Informed Consent: I have reviewed the patients History and Physical, chart, labs and discussed the procedure including the risks, benefits and alternatives for the proposed anesthesia with the patient or authorized representative who has indicated his/her understanding and acceptance.     Plan Discussed with: Anesthesiologist  Anesthesia Plan Comments: (Case discussed with Dr. Jayme Cloud.  GOT/RSI.)        Anesthesia Quick Evaluation

## 2012-02-17 NOTE — Progress Notes (Signed)
Awake. States pain med effective. Wants to see fiance and get something to drink. Peri pad applied. No vag drainage.

## 2012-02-17 NOTE — Progress Notes (Signed)
No vag drainage.

## 2012-02-17 NOTE — H&P (Signed)
Katherine Simmons is an 26 y.o. female. She is admitted to [facility] and short stay center for laparoscopic removal of right ectopic pregnancy. Right salpingectomy is planned due to the advanced gestation. She has been seen in the office yesterday were quantitative hCG was 6000 no identifiable intrauterine gestational sac. She returns today to our office for confirmation and ultrasound again shows an empty uterus but this time we can see a distal right ectopic pregnancy which is 8 weeks size. The fetal pole is quite large and fetal heart motion is absent. A distal ectopic pregnancy is expected to be found. The patient is aware that preservation of the tube is unlikely but possible due to the advanced pregnancy gestation. Patient will have one left tube remaining. The patient desires future childbearing capability  Pertinent Gynecological History: Menses: Last period was early August, August 1, and was abnormally light as previously just recently [redacted] weeks pregnant in the gestational sac is obviously more advanced, and Bleeding: None at present Contraception: none DES exposure: unknown Blood transfusions:  Sexually transmitted diseases: no past history Previous GYN Procedures:   Last mammogram:  Date:  Last pap:  Date:  OB History: G3, P1011   Menstrual History: Menarche age: 29 Patient's last menstrual period was 11/28/2010.    Past Medical History  Diagnosis Date  . Scabies   . Seizure     No past surgical history on file.  Family History  Problem Relation Age of Onset  . Hypertension Mother   . Cancer Mother     Social History:  reports that she has been smoking Cigarettes.  She has been smoking about .5 packs per day. She does not have any smokeless tobacco history on file. She reports that she drinks alcohol. She reports that she does not use illicit drugs.  Allergies: No Known Allergies  No prescriptions prior to admission    ROS no pain or abdominal  bleeding. Weight 288 pounds blood pressure 120/80 Last menstrual period 11/28/2010. Physical ExamPhysical Examination: General appearance - alert, well appearing, and in no distress, overweight, crying and in no pain but absent over the diagnosis Mental status - alert, oriented to person, place, and time, normal mood, behavior, speech, dress, motor activity, and thought processes Eyes - pupils equal and reactive, extraocular eye movements intact Heart - normal rate and regular rhythm Abdomen - soft, nontender, nondistended, no masses or organomegaly exam limited by obesity Transabdominal and transvaginal ultrasound was required to identify the ectopic pregnancy in the distal right tube Pelvic - VULVA: normal appearing vulva with no masses, tenderness or lesions, VAGINA: normal appearing vagina with normal color and discharge, no lesions, CERVIX: normal appearing cervix without discharge or lesions, UTERUS: uterus is normal size, shape, consistency and nontender, anteverted No intrauterine gestational sac, thin endometrium, with ovaries visualized and distal right ampullary ectopic   No results found for this or any previous visit (from the past 24 hour(s)). labs as of Thursday, 02/16/2012 include hemoglobin 11.9 hematocrit 35% progesterone level 5.2 ng per mL and quantitative hCG 6643. Review of old records shows blood type is Rh+  No results found.  Assessment/Plan: Right ectopic pregnancy, unruptured, advanced gestation will proceed to laparoscopic removal of tubal pregnancy probable right salpingectomy today OR notified at [facility] 02/17/2012, 2:14 PM

## 2012-02-17 NOTE — Brief Op Note (Signed)
02/17/2012  4:32 PM  PATIENT:  Katherine Simmons  26 y.o. female  PRE-OPERATIVE DIAGNOSIS:  right ectopic pregnancy  POST-OPERATIVE DIAGNOSIS:  right ectopic pregnancy  PROCEDURE:  Procedure(s) (LRB) with comments: LAPAROSCOPIC UNILATERAL SALPINGECTOMY (Right) - ectopic pregnancy LAPAROSCOPIC LYSIS OF ADHESIONS (N/A)  SURGEON:  Surgeon(s) and Role:    * Tilda Burrow, MD - Primary  PHYSICIAN ASSISTANT:   ASSISTANTS: Marya Landry CST   ANESTHESIA:   general  EBL:  Total I/O In: 1400 [I.V.:1400] Out: 100 [Urine:100]  BLOOD ADMINISTERED:none  DRAINS: none   LOCAL MEDICATIONS USED:  NONE  SPECIMEN:  Source of Specimen:  right tube and ectopic pregnancy tissues  DISPOSITION OF SPECIMEN:  PATHOLOGY  COUNTS:  YES  TOURNIQUET:  * No tourniquets in log *  DICTATION: .Dragon Dictation  PLAN OF CARE: Discharge to home after PACU  PATIENT DISPOSITION:  PACU - hemodynamically stable.   Delay start of Pharmacological VTE agent (>24hrs) due to surgical blood loss or risk of bleeding: not applicable

## 2012-04-04 ENCOUNTER — Other Ambulatory Visit (HOSPITAL_COMMUNITY)
Admission: RE | Admit: 2012-04-04 | Discharge: 2012-04-04 | Disposition: A | Discharge status: Home / Self Care | Payer: Medicaid Other | Source: Ambulatory Visit | Attending: Obstetrics and Gynecology | Admitting: Obstetrics and Gynecology

## 2012-04-04 ENCOUNTER — Other Ambulatory Visit: Payer: Self-pay | Admitting: Adult Health

## 2012-04-04 DIAGNOSIS — Z113 Encounter for screening for infections with a predominantly sexual mode of transmission: Secondary | ICD-10-CM | POA: Insufficient documentation

## 2012-04-04 DIAGNOSIS — Z01419 Encounter for gynecological examination (general) (routine) without abnormal findings: Secondary | ICD-10-CM | POA: Insufficient documentation

## 2013-01-17 ENCOUNTER — Emergency Department (HOSPITAL_COMMUNITY): Payer: Medicaid Other

## 2013-01-17 ENCOUNTER — Encounter (HOSPITAL_COMMUNITY): Payer: Self-pay | Admitting: Emergency Medicine

## 2013-01-17 ENCOUNTER — Inpatient Hospital Stay (HOSPITAL_COMMUNITY): Payer: Medicaid Other

## 2013-01-17 ENCOUNTER — Inpatient Hospital Stay (HOSPITAL_COMMUNITY)
Admission: EM | Admit: 2013-01-17 | Discharge: 2013-01-21 | DRG: 100 | Disposition: A | Discharge status: Home / Self Care | Payer: Medicaid Other | Attending: Internal Medicine | Admitting: Internal Medicine

## 2013-01-17 DIAGNOSIS — I498 Other specified cardiac arrhythmias: Secondary | ICD-10-CM | POA: Diagnosis present

## 2013-01-17 DIAGNOSIS — Z91199 Patient's noncompliance with other medical treatment and regimen due to unspecified reason: Secondary | ICD-10-CM

## 2013-01-17 DIAGNOSIS — G9389 Other specified disorders of brain: Secondary | ICD-10-CM | POA: Diagnosis present

## 2013-01-17 DIAGNOSIS — J9600 Acute respiratory failure, unspecified whether with hypoxia or hypercapnia: Secondary | ICD-10-CM | POA: Diagnosis present

## 2013-01-17 DIAGNOSIS — E876 Hypokalemia: Secondary | ICD-10-CM | POA: Diagnosis present

## 2013-01-17 DIAGNOSIS — G40209 Localization-related (focal) (partial) symptomatic epilepsy and epileptic syndromes with complex partial seizures, not intractable, without status epilepticus: Principal | ICD-10-CM | POA: Diagnosis present

## 2013-01-17 DIAGNOSIS — J189 Pneumonia, unspecified organism: Secondary | ICD-10-CM | POA: Diagnosis present

## 2013-01-17 DIAGNOSIS — Z9119 Patient's noncompliance with other medical treatment and regimen: Secondary | ICD-10-CM

## 2013-01-17 DIAGNOSIS — J9690 Respiratory failure, unspecified, unspecified whether with hypoxia or hypercapnia: Secondary | ICD-10-CM

## 2013-01-17 DIAGNOSIS — E872 Acidosis, unspecified: Secondary | ICD-10-CM | POA: Diagnosis present

## 2013-01-17 DIAGNOSIS — F17200 Nicotine dependence, unspecified, uncomplicated: Secondary | ICD-10-CM | POA: Diagnosis present

## 2013-01-17 DIAGNOSIS — I10 Essential (primary) hypertension: Secondary | ICD-10-CM | POA: Diagnosis present

## 2013-01-17 DIAGNOSIS — S01502A Unspecified open wound of oral cavity, initial encounter: Secondary | ICD-10-CM | POA: Diagnosis present

## 2013-01-17 DIAGNOSIS — S0003XA Contusion of scalp, initial encounter: Secondary | ICD-10-CM | POA: Diagnosis present

## 2013-01-17 DIAGNOSIS — J9601 Acute respiratory failure with hypoxia: Secondary | ICD-10-CM | POA: Diagnosis present

## 2013-01-17 DIAGNOSIS — R739 Hyperglycemia, unspecified: Secondary | ICD-10-CM

## 2013-01-17 DIAGNOSIS — J690 Pneumonitis due to inhalation of food and vomit: Secondary | ICD-10-CM

## 2013-01-17 DIAGNOSIS — N179 Acute kidney failure, unspecified: Secondary | ICD-10-CM | POA: Diagnosis present

## 2013-01-17 DIAGNOSIS — R7309 Other abnormal glucose: Secondary | ICD-10-CM | POA: Diagnosis present

## 2013-01-17 DIAGNOSIS — G40901 Epilepsy, unspecified, not intractable, with status epilepticus: Secondary | ICD-10-CM

## 2013-01-17 DIAGNOSIS — X58XXXA Exposure to other specified factors, initial encounter: Secondary | ICD-10-CM

## 2013-01-17 LAB — RAPID URINE DRUG SCREEN, HOSP PERFORMED
Amphetamines: NOT DETECTED
Barbiturates: NOT DETECTED
Benzodiazepines: NOT DETECTED
Cocaine: NOT DETECTED
Tetrahydrocannabinol: NOT DETECTED

## 2013-01-17 LAB — CBC WITH DIFFERENTIAL/PLATELET
Basophils Absolute: 0 10*3/uL (ref 0.0–0.1)
Basophils Relative: 0 % (ref 0–1)
Eosinophils Absolute: 0 10*3/uL (ref 0.0–0.7)
Hemoglobin: 14.1 g/dL (ref 12.0–15.0)
MCH: 31.2 pg (ref 26.0–34.0)
MCHC: 31.7 g/dL (ref 30.0–36.0)
Monocytes Absolute: 2.7 10*3/uL — ABNORMAL HIGH (ref 0.1–1.0)
Monocytes Relative: 9 % (ref 3–12)
Neutrophils Relative %: 50 % (ref 43–77)
RDW: 13.9 % (ref 11.5–15.5)

## 2013-01-17 LAB — URINALYSIS, ROUTINE W REFLEX MICROSCOPIC
Bilirubin Urine: NEGATIVE
Specific Gravity, Urine: >1.03 — ABNORMAL HIGH (ref 1.005–1.030)
pH: 5.5 (ref 5.0–8.0)

## 2013-01-17 LAB — BASIC METABOLIC PANEL
BUN: 11 mg/dL (ref 6–23)
Creatinine, Ser: 1.31 mg/dL — ABNORMAL HIGH (ref 0.50–1.10)
GFR calc non Af Amer: 55 mL/min — ABNORMAL LOW (ref >90)
Glucose, Bld: 237 mg/dL — ABNORMAL HIGH (ref 70–99)
Potassium: 3.4 mEq/L — ABNORMAL LOW (ref 3.5–5.1)

## 2013-01-17 LAB — BLOOD GAS, ARTERIAL
Acid-base deficit: 11.4 mmol/L — ABNORMAL HIGH (ref 0.0–2.0)
Bicarbonate: 13.1 mEq/L — ABNORMAL LOW (ref 20.0–24.0)
Bicarbonate: 14.8 mEq/L — ABNORMAL LOW (ref 20.0–24.0)
FIO2: 1 %
O2 Saturation: 87.9 %
O2 Saturation: 95.9 %
Patient temperature: 37
TCO2: 12.4 mmol/L (ref 0–100)
TCO2: 14.2 mmol/L (ref 0–100)
pO2, Arterial: 112 mmHg — ABNORMAL HIGH (ref 80.0–100.0)

## 2013-01-17 LAB — URINE MICROSCOPIC-ADD ON

## 2013-01-17 LAB — POCT PREGNANCY, URINE: Preg Test, Ur: NEGATIVE

## 2013-01-17 MED ORDER — LORAZEPAM 2 MG/ML IJ SOLN
INTRAMUSCULAR | Status: AC
  Filled 2013-01-17: qty 1

## 2013-01-17 MED ORDER — ROCURONIUM BROMIDE 50 MG/5ML IV SOLN
INTRAVENOUS | Status: AC
  Filled 2013-01-17: qty 2

## 2013-01-17 MED ORDER — SODIUM CHLORIDE 0.9 % IV BOLUS (SEPSIS)
1000.0000 mL | Freq: Once | INTRAVENOUS | Status: AC
  Administered 2013-01-17: 1000 mL via INTRAVENOUS

## 2013-01-17 MED ORDER — SODIUM CHLORIDE 0.9 % IV SOLN
1000.0000 mg | Freq: Once | INTRAVENOUS | Status: AC
  Administered 2013-01-17: 1000 mg via INTRAVENOUS
  Filled 2013-01-17: qty 10

## 2013-01-17 MED ORDER — LIDOCAINE HCL (CARDIAC) 20 MG/ML IV SOLN
INTRAVENOUS | Status: AC
  Filled 2013-01-17: qty 5

## 2013-01-17 MED ORDER — PROPOFOL 10 MG/ML IV BOLUS
INTRAVENOUS | Status: AC
  Administered 2013-01-18: 50 mg
  Filled 2013-01-17: qty 1

## 2013-01-17 MED ORDER — PROPOFOL 10 MG/ML IV EMUL
INTRAVENOUS | Status: AC
  Filled 2013-01-17: qty 100

## 2013-01-17 MED ORDER — DEXTROSE 5 % IV SOLN
1.0000 g | Freq: Once | INTRAVENOUS | Status: AC
  Administered 2013-01-18: 1 g via INTRAVENOUS
  Filled 2013-01-17: qty 10

## 2013-01-17 MED ORDER — ETOMIDATE 2 MG/ML IV SOLN
INTRAVENOUS | Status: AC
  Administered 2013-01-17: 20 mg
  Filled 2013-01-17: qty 20

## 2013-01-17 MED ORDER — PROPOFOL 10 MG/ML IV EMUL
5.0000 ug/kg/min | Freq: Once | INTRAVENOUS | Status: AC
  Administered 2013-01-17: 5 ug/kg/min via INTRAVENOUS

## 2013-01-17 MED ORDER — LORAZEPAM 2 MG/ML IJ SOLN
2.0000 mg | Freq: Once | INTRAMUSCULAR | Status: AC
  Administered 2013-01-17: 2 mg via INTRAVENOUS

## 2013-01-17 MED ORDER — LEVETIRACETAM 500 MG/5ML IV SOLN
INTRAVENOUS | Status: AC
  Filled 2013-01-17: qty 10

## 2013-01-17 MED ORDER — LORAZEPAM 2 MG/ML IJ SOLN
1.0000 mg | Freq: Once | INTRAMUSCULAR | Status: AC
  Administered 2013-01-17: 1 mg via INTRAVENOUS

## 2013-01-17 MED ORDER — DEXTROSE 5 % IV SOLN: 500.0000 mg | Freq: Once | INTRAVENOUS | Status: DC

## 2013-01-17 MED ORDER — LORAZEPAM 2 MG/ML IJ SOLN
INTRAMUSCULAR | Status: AC
  Administered 2013-01-17: 2 mg
  Filled 2013-01-17: qty 1

## 2013-01-17 MED ORDER — SUCCINYLCHOLINE CHLORIDE 20 MG/ML IJ SOLN
INTRAMUSCULAR | Status: AC
  Administered 2013-01-17: 200 mg
  Filled 2013-01-17: qty 1

## 2013-01-17 NOTE — ED Notes (Signed)
Dr. Bebe Shaggy reassessing patient at this time.

## 2013-01-17 NOTE — ED Notes (Signed)
Patient began seizing again. Grand mal seizure lasting approximately 60 seconds. Dr. Bebe Shaggy in room at time. Patient's mouth suctioned, small amount of bloody secretions.

## 2013-01-17 NOTE — Progress Notes (Signed)
ET tube pulled back to 22 @ lips per MD.

## 2013-01-17 NOTE — ED Provider Notes (Signed)
CSN: [id number]     Arrival date & time 01/17/13  2017 History     First MD Initiated Contact with Patient 01/17/13 2024     Chief Complaint  Patient presents with  . Seizures    Patient is a 27 y.o. female presenting with seizures. The history is provided by the EMS personnel and a significant other. The history is limited by the condition of the patient.  Seizures Seizure activity on arrival: yes   Seizure type:  Grand mal Preceding symptoms: headache   Initial focality:  None Episode characteristics: tongue biting   Severity:  Moderate Timing:  Intermittent Progression:  Worsening PTA treatment:  None History of seizures: yes    Pt presents from home for seizure Per fiance, pt was at her baseline but had small amount of ETOH tonight.  She was walking into the door and fell into door with her head and then fell back and had seizure EMS was called.  On EMS arrival her seizure had stopped and she was awake/alert and talking.  She then had another seizure which lasted until arrival  Per fiance, pt has not had any seizure meds (takes keppra) since last year He has never witnessed seizure prior to tonight She has had recent headaches but otherwise well No drug use is reported Past Medical History  Diagnosis Date  . Scabies   . Seizure last 07/2009   Past Surgical History  Procedure Laterality Date  . No past surgeries     Family History  Problem Relation Age of Onset  . Hypertension Mother   . Cancer Mother    History  Substance Use Topics  . Smoking status: Current Every Day Smoker -- 0.50 packs/day    Types: Cigarettes  . Smokeless tobacco: Not on file  . Alcohol Use: Yes     Comment: occasional   OB History   Grav Para Term Preterm Abortions TAB SAB Ect Mult Living   3 1   1           Obstetric Comments   O POSITIVE per old records     Review of Systems  Unable to perform ROS: Mental status change  Neurological: Positive for seizures.    Allergies   Review of patient's allergies indicates no known allergies.  Home Medications   Current Outpatient Rx  Name  Route  Sig  Dispense  Refill  . levETIRAcetam (KEPPRA) 250 MG tablet   Oral   Take 250 mg by mouth every 12 (twelve) hours.         Marland Kitchen PRESCRIPTION MEDICATION      Unknown cream for scabies           BP 99/47  Pulse 152  Resp 46  SpO2 99%  Breastfeeding? Unknown BP 100/31  Pulse 139  Temp(Src) 99.5 F (37.5 C) (Rectal)  Resp 32  Wt 288 lb (130.636 kg)  BMI 46.51 kg/m2  SpO2 100%  Breastfeeding? Unknown  Physical Exam CONSTITUTIONAL: ill appearing ,seizing on arival HEAD: Normocephalic/atraumatic, forehead hematoma EYES: PERRL ENMT: Mucous membranes moist, blood noted at oropharynx, tongue laceration noted NECK: no signs of trauma, No crepitance/stepoffs noted to spine SPINE No bruising/crepitance/stepoffs noted to spine CV: tachycardic, S1/S2 noted, no murmurs/rubs/gallops noted LUNGS: tachypneic, coarse breath sounds bilaterally ABDOMEN: soft, nontender, no rebound or guarding, obese GU:no cva tenderness NEURO: Pt is seizing on arrival, tonic clonic seizure noted EXTREMITIES: pulses normal,  no signs of trauma or deformity SKIN: warm, color normal Psych - unable  to assess due to mental status  ED Course   Procedures   CRITICAL CARE Performed by: Joya Gaskins Total critical care time: 50 Critical care time was exclusive of separately billable procedures and treating other patients. Critical care was necessary to treat or prevent imminent or life-threatening deterioration. Critical care was time spent personally by me on the following activities: development of treatment plan with patient and/or surrogate as well as nursing, discussions with consultants, evaluation of patient's response to treatment, examination of patient, obtaining history from patient or surrogate, ordering and performing treatments and interventions, ordering and review of  laboratory studies, ordering and review of radiographic studies, pulse oximetry and re-evaluation of patient's condition.  INTUBATION Performed by: Joya Gaskins  Required items: required blood products, implants, devices, and special equipment available Patient identity confirmed: provided demographic data and hospital-assigned identification number Time out: Immediately prior to procedure a "time out" was called to verify the correct patient, procedure, equipment, support staff and site/side marked as required.  Indications: respiratory failure  Intubation method: Glidescope Laryngoscopy   Preoxygenation: BVM  Sedatives: Etomidate Paralytic: Succinylcholine  Tube Size: 7.5 cuffed  Post-procedure assessment: chest rise and ETCO2 monitor Breath sounds: equal and absent over the epigastrium Tube secured with: ETT holder Chest x-ray reviewed by myself   Patient tolerated the procedure well with no immediate complications.     Labs Reviewed  BASIC METABOLIC PANEL  CBC WITH DIFFERENTIAL  URINALYSIS, ROUTINE W REFLEX MICROSCOPIC  URINE RAPID DRUG SCREEN (HOSP PERFORMED)   8:56 PM Pt seen on arrival with seizure Given ativan and seizure stopped.  While monitoring, she had another seizure which stopped on its own but she was given another dose of ativan.  C-collar placed as pt did fall but no other signs of trauma.  keppra has been ordered (pt is supposed to be on keppra at home) At this point pt has been given ativan 2mg  X2.  A nasal airway has been placed and she is oxygenating Ct imaging ordered.  Will follow closely 9:47 PM Pt tachypneic/tachycardic but is starting to have some arousal.  However due to tachypnea, will obtain CXR She is not hypoxic For her acidosis, could be from lactate (recent seizure) ABG ordered Continued IV fluids She has been given keppra 10:48 PM Pt is now hypoxic,acidotic, tachypneic and pneumonia noted. Given her clinical appearance, will  intubate Pt did have some purposeful movement just prior to intubation, and no active seizure D/w dr Tyson Alias at [facility]  Will place in ICU.  He recommends RR at 20-22 for acidosis.  He does not recommend bicarb IV fluids given Propofol started 11:35 PM Pt stabilized for transport Iv antibiotics started for pneumonia   MDM  Nursing notes including past medical history and social history reviewed and considered in documentation Labs/vital reviewed and considered Previous records reviewed and considered - h/o seizures per chart xrays reviewed and considered    Date: 01/17/2013  Rate: 154  Rhythm: sinus tachycardia  QRS Axis: normal  Intervals: normal  ST/T Wave abnormalities: nonspecific ST changes  Conduction Disutrbances:none Significant artifact noted          Joya Gaskins, MD 01/17/13 2336

## 2013-01-17 NOTE — ED Notes (Signed)
Nasal trumpet placed in left nare by Dr. Bebe Shaggy.

## 2013-01-17 NOTE — ED Notes (Signed)
CRITICAL VALUE ALERT  Critical value received:  CO2 > 7  Date of notification:  0/18/14  Time of notification:  2127  Critical value read back:yes Nurse who received alert:  Leitha Bleak  MD notified (1st page): [place]  Time of first page:  2127  MD notified (2nd page): [place]  Time of second page:2127  Responding MD:  Bebe Shaggy  Time MD responded:  2128

## 2013-01-17 NOTE — ED Notes (Addendum)
Patient began seizing. Seizure lasted approximately 60 seconds, grand mal seizure. 2 mg of Ativan administered via IV. Dr. Bebe Shaggy notified and came into room assessing patient. Mouth suctioned after seizure ended. Small amount of blood suctioned from mouth.

## 2013-01-18 ENCOUNTER — Encounter (HOSPITAL_COMMUNITY): Payer: Self-pay | Admitting: Pulmonary Disease

## 2013-01-18 ENCOUNTER — Inpatient Hospital Stay (HOSPITAL_COMMUNITY): Payer: Medicaid Other

## 2013-01-18 DIAGNOSIS — J9600 Acute respiratory failure, unspecified whether with hypoxia or hypercapnia: Secondary | ICD-10-CM

## 2013-01-18 DIAGNOSIS — N179 Acute kidney failure, unspecified: Secondary | ICD-10-CM | POA: Diagnosis present

## 2013-01-18 DIAGNOSIS — J9601 Acute respiratory failure with hypoxia: Secondary | ICD-10-CM | POA: Diagnosis present

## 2013-01-18 DIAGNOSIS — R739 Hyperglycemia, unspecified: Secondary | ICD-10-CM | POA: Diagnosis present

## 2013-01-18 DIAGNOSIS — G40901 Epilepsy, unspecified, not intractable, with status epilepticus: Secondary | ICD-10-CM | POA: Diagnosis present

## 2013-01-18 LAB — BLOOD GAS, ARTERIAL
Bicarbonate: 16.2 mEq/L — ABNORMAL LOW (ref 20.0–24.0)
PEEP: 5 cmH2O
pH, Arterial: 7.326 — ABNORMAL LOW (ref 7.350–7.450)
pO2, Arterial: 294 mmHg — ABNORMAL HIGH (ref 80.0–100.0)

## 2013-01-18 LAB — CBC
HCT: 33.9 % — ABNORMAL LOW (ref 36.0–46.0)
Hemoglobin: 11.8 g/dL — ABNORMAL LOW (ref 12.0–15.0)
MCH: 31 pg (ref 26.0–34.0)
MCV: 89 fL (ref 78.0–100.0)
RBC: 3.81 MIL/uL — ABNORMAL LOW (ref 3.87–5.11)

## 2013-01-18 LAB — BASIC METABOLIC PANEL
Chloride: 109 mEq/L (ref 96–112)
Creatinine, Ser: 1.14 mg/dL — ABNORMAL HIGH (ref 0.50–1.10)
GFR calc Af Amer: 75 mL/min — ABNORMAL LOW (ref >90)
Potassium: 4.1 mEq/L (ref 3.5–5.1)

## 2013-01-18 LAB — HEMOGLOBIN A1C
Hgb A1c MFr Bld: 5.9 % — ABNORMAL HIGH (ref <5.7)
Mean Plasma Glucose: 123 mg/dL — ABNORMAL HIGH (ref <117)

## 2013-01-18 LAB — GLUCOSE, CAPILLARY
Glucose-Capillary: 118 mg/dL — ABNORMAL HIGH (ref 70–99)
Glucose-Capillary: 93 mg/dL (ref 70–99)
Glucose-Capillary: 110 mg/dL — ABNORMAL HIGH (ref 70–99)

## 2013-01-18 LAB — PROCALCITONIN: Procalcitonin: 3.84 ng/mL

## 2013-01-18 LAB — LACTIC ACID, PLASMA: Lactic Acid, Venous: 2.4 mmol/L — ABNORMAL HIGH (ref 0.5–2.2)

## 2013-01-18 LAB — CORTISOL: Cortisol, Plasma: 18.2 ug/dL

## 2013-01-18 LAB — MAGNESIUM: Magnesium: 2.5 mg/dL (ref 1.5–2.5)

## 2013-01-18 MED ORDER — KCL IN DEXTROSE-NACL 20-5-0.45 MEQ/L-%-% IV SOLN
INTRAVENOUS | Status: DC
  Administered 2013-01-18: 21:00:00 via INTRAVENOUS
  Filled 2013-01-18 (×4): qty 1000

## 2013-01-18 MED ORDER — SODIUM CHLORIDE 0.9 % IV SOLN
25.0000 ug/h | INTRAVENOUS | Status: DC
  Administered 2013-01-18 – 2013-01-19 (×2): 100 ug/h via INTRAVENOUS
  Filled 2013-01-18 (×2): qty 50

## 2013-01-18 MED ORDER — SODIUM CHLORIDE 0.9 % IV SOLN
INTRAVENOUS | Status: DC
  Administered 2013-01-18 (×3): 1000 mL via INTRAVENOUS

## 2013-01-18 MED ORDER — JEVITY 1.2 CAL PO LIQD
1000.0000 mL | ORAL | Status: DC
  Administered 2013-01-18: 1000 mL
  Filled 2013-01-18 (×3): qty 1000

## 2013-01-18 MED ORDER — ENOXAPARIN SODIUM 40 MG/0.4ML ~~LOC~~ SOLN
40.0000 mg | SUBCUTANEOUS | Status: DC
  Administered 2013-01-18 – 2013-01-20 (×3): 40 mg via SUBCUTANEOUS
  Filled 2013-01-18 (×3): qty 0.4

## 2013-01-18 MED ORDER — DEXTROSE 5 % IV SOLN
500.0000 mg | INTRAVENOUS | Status: DC
  Administered 2013-01-18 – 2013-01-20 (×3): 500 mg via INTRAVENOUS
  Filled 2013-01-18 (×3): qty 500

## 2013-01-18 MED ORDER — FENTANYL CITRATE 0.05 MG/ML IJ SOLN
50.0000 ug | INTRAMUSCULAR | Status: DC | PRN
  Administered 2013-01-18: 100 ug via INTRAVENOUS

## 2013-01-18 MED ORDER — LORAZEPAM 2 MG/ML IJ SOLN: 2.0000 mg | INTRAMUSCULAR | Status: DC | PRN

## 2013-01-18 MED ORDER — CHLORHEXIDINE GLUCONATE 0.12 % MT SOLN
15.0000 mL | Freq: Two times a day (BID) | OROMUCOSAL | Status: DC
Start: 2013-01-18 — End: 2013-01-20
  Administered 2013-01-18 – 2013-01-19 (×4): 15 mL via OROMUCOSAL
  Filled 2013-01-18 (×4): qty 15

## 2013-01-18 MED ORDER — PANTOPRAZOLE SODIUM 40 MG IV SOLR
40.0000 mg | Freq: Every day | INTRAVENOUS | Status: DC
  Administered 2013-01-18: 40 mg via INTRAVENOUS
  Filled 2013-01-18 (×2): qty 40

## 2013-01-18 MED ORDER — PROPOFOL 10 MG/ML IV EMUL
5.0000 ug/kg/min | INTRAVENOUS | Status: DC
  Administered 2013-01-18 – 2013-01-19 (×10): 50 ug/kg/min via INTRAVENOUS
  Filled 2013-01-18 (×11): qty 100

## 2013-01-18 MED ORDER — BIOTENE DRY MOUTH MT LIQD
15.0000 mL | Freq: Four times a day (QID) | OROMUCOSAL | Status: DC
  Administered 2013-01-18 – 2013-01-19 (×5): 15 mL via OROMUCOSAL

## 2013-01-18 MED ORDER — SODIUM CHLORIDE 0.9 % IV SOLN: 250.0000 mL | INTRAVENOUS | Status: DC | PRN

## 2013-01-18 MED ORDER — SODIUM CHLORIDE 0.9 % IV SOLN
500.0000 mg | Freq: Two times a day (BID) | INTRAVENOUS | Status: DC
  Administered 2013-01-18 – 2013-01-20 (×5): 500 mg via INTRAVENOUS
  Filled 2013-01-18 (×6): qty 5

## 2013-01-18 MED ORDER — FENTANYL CITRATE 0.05 MG/ML IJ SOLN
INTRAMUSCULAR | Status: AC
  Filled 2013-01-18: qty 2

## 2013-01-18 MED ORDER — DEXTROSE 5 % IV SOLN
1.0000 g | INTRAVENOUS | Status: DC
  Administered 2013-01-18 – 2013-01-19 (×2): 1 g via INTRAVENOUS
  Filled 2013-01-18 (×3): qty 10

## 2013-01-18 MED ORDER — PROPOFOL 10 MG/ML IV EMUL
INTRAVENOUS | Status: AC
  Administered 2013-01-18: 50 ug/kg/min
  Filled 2013-01-18: qty 100

## 2013-01-18 MED ORDER — PRO-STAT SUGAR FREE PO LIQD
30.0000 mL | Freq: Four times a day (QID) | ORAL | Status: DC
  Administered 2013-01-18 (×3): 30 mL
  Filled 2013-01-18 (×7): qty 30

## 2013-01-18 MED ORDER — INSULIN ASPART 100 UNIT/ML ~~LOC~~ SOLN
2.0000 [IU] | SUBCUTANEOUS | Status: DC
  Administered 2013-01-18: 2 [IU] via SUBCUTANEOUS

## 2013-01-18 NOTE — H&P (Signed)
PULMONARY  / CRITICAL CARE MEDICINE  Name: Katherine Simmons MRN: [id number] DOB: [birth year]    ADMISSION DATE:  01/17/2013 CONSULTATION DATE:  01/17/2013  REFERRING MD :  Bebe Shaggy [facility] ED PRIMARY SERVICE: PCCM  CHIEF COMPLAINT:  Status epilepticus  BRIEF PATIENT DESCRIPTION: 27 y/o female with known epilepsy was admitted [facility] 8/15 [facility] with status epilepticus, pneumonia, and acute respiratory failure requiring intubation.  SIGNIFICANT EVENTS / STUDIES:  8/15 CT head > Left frontal scalp swelling, no acute intracranial finding; stable encephalomalacia 8/15 C spine > limited study, no gross evidence for C-spine abnormality  LINES / TUBES: 8/14 ETT >>  CULTURES: 8/15 blood >> 8/15 urine >> 8/15 resp >>  ANTIBIOTICS: 8/15 ceftriaxone >> 8/15 azithro >>  HISTORY OF PRESENT ILLNESS:  27 y/o female with known epilepsy was admitted on 8/15 from [facility] with status epilepticus, pneumonia, and acute respiratory failure requiring intubation. She cannot provide history because of intubation.  History is obtained from her boyfriend and chart review. Apparently she had a history of severe recurrent seizures when she was younger and was started on Keppra.  For several years she has not had seizures however so she stopped taking that medication at least one year prior to admission. Two weeks prior to admission she noted headaches.  On the night of admission she had one drink of alcohol (unusual for her) while out with friends.  While on her way back home she fell and hit her head while walking into her home and had a seizure which prompted her friends to call 911.  Her boyfriend is uncertain about the timing of the fall and seizure (which came first).  In the [facility] ED she had several more seizures (total of five) and despite a prolonged post ictal period she did eventually make purposeful movements.  She was noted to have pneumonia on a CXR and had a metabolic acidosis.     PAST MEDICAL  HISTORY :  Past Medical History  Diagnosis Date  . Scabies   . Seizure last 07/2009   Past Surgical History  Procedure Laterality Date  . No past surgeries     Prior to Admission medications   Medication Sig Start Date End Date Taking? Authorizing Provider  acetaminophen (TYLENOL) 500 MG tablet Take 500 mg by mouth every 6 (six) hours as needed for pain.   Yes Historical Provider, MD  levETIRAcetam (KEPPRA) 250 MG tablet Take 250 mg by mouth every 12 (twelve) hours.    Historical Provider, MD   No Known Allergies  FAMILY HISTORY (chart review):  Family History  Problem Relation Age of Onset  . Hypertension Mother   . Cancer Mother    SOCIAL HISTORY (chart review/discussion with boyfriend):  reports that she has been smoking Cigarettes.  She has been smoking about 0.50 packs per day. She does not have any smokeless tobacco history on file. She reports that she drinks about 0.5 ounces of alcohol per week. She reports that she does not use illicit drugs.  REVIEW OF SYSTEMS:  Cannot obtain due to intubation  SUBJECTIVE:   VITAL SIGNS: Temp:  [99.1 F (37.3 C)-99.5 F (37.5 C)] 99.1 F (37.3 C) (08/15 0110) Pulse Rate:  [133-167] 133 (08/15 0111) Resp:  [20-49] 32 (08/15 0111) BP: (99-145)/(31-87) 107/49 mmHg (08/15 0110) SpO2:  [93 %-100 %] 99 % (08/15 0111) FiO2 (%):  [100 %] 100 % (08/15 0111) Weight:  [130.636 kg (288 lb)] 130.636 kg (288 lb) (08/14 2305)  HEMODYNAMICS:   VENTILATOR SETTINGS: Vent Mode:  [-] PRVC FiO2 (%):  [100 %] 100 % Set Rate:  [20 bmp-22 bmp] 22 bmp Vt Set:  [460 mL] 460 mL PEEP:  [5 cmH20] 5 cmH20 Plateau Pressure:  [17 cmH20-30 cmH20] 17 cmH20 INTAKE / OUTPUT: Intake/Output     08/14 0701 - 08/15 0700   I.V. (mL/kg) 50 (0.4)   Total Intake(mL/kg) 50 (0.4)   Net +50         PHYSICAL EXAMINATION:  Gen: obese, sedated on vent HEENT: Small contusion L frontal scalp, PERRL, EOMi, ETT in place PULM: Insp crackles on R, CTA on L CV:  Tachy, regular, distant heart sounds, cannot assess JVD AB: BS+, soft, nontender, no hsm Ext: warm, trace edema legs, no clubbing, no cyanosis Derm: no rash or skin breakdown Neuro: Sedated on vent but makes purposeful movements (reaching for tube, adjusting position in bed)   LABS:  CBC Recent Labs     01/17/13  2040  WBC  30.0*  HGB  14.1  HCT  44.5  PLT  386   Coag's No results found for this basename: APTT, INR,  in the last 72 hours BMET Recent Labs     01/17/13  2040  NA  146*  K  3.4*  CL  100  CO2  <7*  BUN  11  CREATININE  1.31*  GLUCOSE  237*   Electrolytes Recent Labs     01/17/13  2040  CALCIUM  11.1*   Sepsis Markers No results found for this basename: LACTICACIDVEN, PROCALCITON, O2SATVEN,  in the last 72 hours ABG Recent Labs     01/17/13  2155  01/17/13  2325  PHART  7.214*  7.220*  PCO2ART  33.7*  37.6  PO2ART  72.5*  112.0*   Liver Enzymes No results found for this basename: AST, ALT, ALKPHOS, BILITOT, ALBUMIN,  in the last 72 hours Cardiac Enzymes No results found for this basename: TROPONINI, PROBNP,  in the last 72 hours Glucose Recent Labs     01/17/13  2029  01/18/13  0117  GLUCAP  255*  118*    Imaging  CXR: ETT in place, poor study, RUL infiltrate  ASSESSMENT / PLAN:  PULMONARY A: Acute respiratory failure due to inability protect airway (post ictal) and increased WOB from metabolic acidosis and pneumonia Pneumonia > CAP/aspiration P:   -see ID -ABG now -continue vent with high RR -wean O2 based on results of ABG -Daily CXR/WAU/SBT  CARDIOVASCULAR A: Sinus tachycardia due to agitation and sepsis from pneumonia P:  -bolus NS now -continuous NS at 150cc/hr -tele -12 lead now  RENAL A:  AKI > presumably related to sepsis Elevated anion gap metabolic acidosis> at this point most likely scenario is elevated lactic acid from post ictal state; consider ketosis or ingestion (but nothing by history);  improving Hypercalcemia > uncertain etiology Hypokalemia > again, unclear etiology P: -repeat BMET now -check iPTH -continue IVF -check lactic acid, ketones -monitor UOP  GASTROINTESTINAL A:  No acute issues P:   -OG tube -Pantoprazole for stress ulcer prophylaxis  HEMATOLOGIC A:  No acute issues P:  -monitor for bleeding  INFECTIOUS A:  Pneumonia > community acquired, likely aspiration P:   -resp culture -follow blood culture -ceftriaxone/azithro  ENDOCRINE A:  Hyperglycemia, DM2? P:   -ICU hyperglycemia protocol  NEUROLOGIC A:  Status epilepticus, non-compliance with keppra Encephalomalacia R frontal lobe, chronic, etiology? P:   -neurology consult -keppra -continue propofol and fentanyl gtt  for sedation titrated to RASS -2  TODAY'S SUMMARY:   I have personally obtained a history, examined the patient, evaluated laboratory and imaging results, formulated the assessment and plan and placed orders. CRITICAL CARE: The patient is critically ill with multiple organ systems failure and requires high complexity decision making for assessment and support, frequent evaluation and titration of therapies, application of advanced monitoring technologies and extensive interpretation of multiple databases. Critical Care Time devoted to patient care services described in this note is 60 minutes.   Fonnie Jarvis Pulmonary and Critical [facility] Medicine [facility] Pager: [phone]  01/18/2013, 3:14 AM
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
## 2013-01-18 NOTE — ED Notes (Signed)
20 mg of etomidate and 200 mg of succinylcholine administered as verbally ordered by Dr. Bebe Shaggy prior to intubation.

## 2013-01-18 NOTE — Progress Notes (Signed)
UR completed 

## 2013-01-18 NOTE — ED Notes (Signed)
Blood cultures obtained at 0005.

## 2013-01-18 NOTE — Procedures (Signed)
EEG report.  Brief clinical history:27 year old female with a history of seizures off medication for the past year presents with status epilepticus. Has been seizure free for at least 2 years. Now placed back on Keppra.    Technique: this is a 17 channel routine scalp EEG performed at the bedside in the ICU setting, with bipolar and monopolar montages arranged in accordance to the international 10/20 system of electrode placement. One channel was dedicated to EKG recording.  Patient is sedated with propofol and fentanyl, intubated on the vent. No activating procedures.  Description: patient is sedated on the vent and thus assessment of the background is not feasible. Otherwise, the entire recording showed not evidence of slowing or epileptiform discharges. No electrographic seizures noted. EKG showed sinus rhythm.  Impression: this is a normal EEG performed while the patient is heavily sedated with propofol and fentanyl. Please, be aware that a normal EEG does not exclude the possibility of epilepsy.  Clinical correlation is advised.  Wyatt Portela, MD

## 2013-01-18 NOTE — ED Notes (Signed)
Patient seized on CT table in radiology. Grand mal seizure lasted approximately 30 seconds. Mouth suctioned after seizure, small amount of blood-tinged mucus suctioned. Patient in postictal state after seizure with snoring respirations. Oxygen saturation dropped to 78%. Patient on 15 liters of oxygen via non-re breather mask. Oxygen saturation came up to 95% within 60 seconds. Dr. Bebe Shaggy called and notified. Stated to administer 1 mg of Ativan IV.

## 2013-01-18 NOTE — Progress Notes (Signed)
Portable EEG completed

## 2013-01-18 NOTE — Progress Notes (Signed)
PULMONARY  / CRITICAL CARE MEDICINE  Name: Katherine Simmons MRN: [id number] DOB: 01-Jul-1985    ADMISSION DATE:  01/17/2013 CONSULTATION DATE:  01/17/2013  REFERRING MD :  Bebe Shaggy [facility] PRIMARY SERVICE: PCCM  CHIEF COMPLAINT:  Status epilepticus  BRIEF PATIENT DESCRIPTION: 27 y/o female with known epilepsy was admitted on 8/15 from the [facility] with status epilepticus, pneumonia, and acute respiratory failure requiring intubation.  SIGNIFICANT EVENTS / STUDIES:  8/15 CT head > Left frontal scalp swelling, no acute intracranial finding; stable encephalomalacia 8/15 C spine > limited study, no gross evidence for C-spine abnormality  LINES / TUBES: 8/14 ETT >>  CULTURES: 8/15 blood >> 8/15 urine >> 8/15 resp >>  ANTIBIOTICS: 8/15 ceftriaxone >> 8/15 azithro >>  HISTORY OF PRESENT ILLNESS:  27 y/o female with known epilepsy was admitted on 8/15 from the [facility] with status epilepticus, pneumonia, and acute respiratory failure requiring intubation. She cannot provide history because of intubation.  History is obtained from her boyfriend and chart review. Apparently she had a history of severe recurrent seizures when she was younger and was started on Keppra.  For several years she has not had seizures however so she stopped taking that medication at least one year prior to admission. Two weeks prior to admission she noted headaches.  On the night of admission she had one drink of alcohol (unusual for her) while out with friends.  While on her way back home she fell and hit her head while walking into her home and had a seizure which prompted her friends to call 911.  Her boyfriend is uncertain about the timing of the fall and seizure (which came first).  In the [facility] ED she had several more seizures (total of five) and despite a prolonged post ictal period she did eventually make purposeful movements.  She was noted to have pneumonia on a CXR and had a metabolic acidosis.     PAST MEDICAL  HISTORY :  Past Medical History  Diagnosis Date  . Scabies   . Seizure last 07/2009   Past Surgical History  Procedure Laterality Date  . No past surgeries     Prior to Admission medications   Medication Sig Start Date End Date Taking? Authorizing Provider  acetaminophen (TYLENOL) 500 MG tablet Take 500 mg by mouth every 6 (six) hours as needed for pain.   Yes Historical Provider, MD  levETIRAcetam (KEPPRA) 250 MG tablet Take 250 mg by mouth every 12 (twelve) hours.    Historical Provider, MD   No Known Allergies  FAMILY HISTORY (chart review):  Family History  Problem Relation Age of Onset  . Hypertension Mother   . Cancer Mother    SOCIAL HISTORY (chart review/discussion with boyfriend):  reports that she has been smoking Cigarettes.  She has been smoking about 0.50 packs per day. She does not have any smokeless tobacco history on file. She reports that she drinks about 0.5 ounces of alcohol per week. She reports that she does not use illicit drugs.  REVIEW OF SYSTEMS:  Cannot obtain due to intubation  SUBJECTIVE:   VITAL SIGNS: Temp:  [98.8 F (37.1 C)-101 F (38.3 C)] 98.8 F (37.1 C) (08/15 1138) Pulse Rate:  [107-167] 110 (08/15 1300) Resp:  [20-49] 23 (08/15 1300) BP: (99-145)/(31-87) 110/62 mmHg (08/15 1300) SpO2:  [93 %-100 %] 100 % (08/15 1300) FiO2 (%):  [40 %-100 %] 40 % (08/15 1156) Weight:  [130 kg (286 lb 9.6 oz)-130.636 kg (288 lb)]  130 kg (286 lb 9.6 oz) (08/15 0500) HEMODYNAMICS:   VENTILATOR SETTINGS: Vent Mode:  [-] PRVC FiO2 (%):  [40 %-100 %] 40 % Set Rate:  [20 bmp-22 bmp] 22 bmp Vt Set:  [460 mL] 460 mL PEEP:  [5 cmH20] 5 cmH20 Plateau Pressure:  [17 cmH20-30 cmH20] 23 cmH20 INTAKE / OUTPUT: Intake/Output     08/14 0701 - 08/15 0700 08/15 0701 - 08/16 0700   I.V. (mL/kg) 814.9 (6.3) 996 (7.7)   IV Piggyback  355   Total Intake(mL/kg) 814.9 (6.3) 1351 (10.4)   Urine (mL/kg/hr) 750 720 (0.8)   Emesis/NG output  50 (0.1)   Total Output  750 770   Net +64.9 +581          PHYSICAL EXAMINATION:  Gen: obese, sedated on vent HEENT: Small contusion L frontal scalp, PERRL, EOMi, ETT in place PULM: Insp crackles on R, CTA on L CV: Tachy, regular, distant heart sounds, cannot assess JVD AB: BS+, soft, nontender, no hsm Ext: warm, trace edema legs, no clubbing, no cyanosis Derm: no rash or skin breakdown Neuro: Sedated on vent but makes purposeful movements (reaching for tube, adjusting position in bed)   LABS:  CBC Recent Labs     01/17/13  2040  01/18/13  0420  WBC  30.0*  25.5*  HGB  14.1  11.8*  HCT  44.5  33.9*  PLT  386  282   Coag's No results found for this basename: APTT, INR,  in the last 72 hours BMET Recent Labs     01/17/13  2040  01/18/13  0420  NA  146*  138  K  3.4*  4.1  CL  100  109  CO2  <7*  16*  BUN  11  8  CREATININE  1.31*  1.14*  GLUCOSE  237*  115*   Electrolytes Recent Labs     01/17/13  2040  01/18/13  0420  CALCIUM  11.1*  7.9*  MG   --   2.5  PHOS   --   2.2*   Sepsis Markers Recent Labs     01/18/13  0420  PROCALCITON  3.84   ABG Recent Labs     01/17/13  2155  01/17/13  2325  01/18/13  0320  PHART  7.214*  7.220*  7.326*  PCO2ART  33.7*  37.6  32.0*  PO2ART  72.5*  112.0*  294.0*   Liver Enzymes No results found for this basename: AST, ALT, ALKPHOS, BILITOT, ALBUMIN,  in the last 72 hours Cardiac Enzymes No results found for this basename: TROPONINI, PROBNP,  in the last 72 hours Glucose Recent Labs     01/17/13  2029  01/18/13  0117  01/18/13  0401  01/18/13  0818  01/18/13  1137  GLUCAP  255*  118*  93  102*  122*    Imaging  CXR: ETT in place, poor study, RUL infiltrate  ASSESSMENT / PLAN:  PULMONARY A: Acute respiratory failure due to inability protect airway (post ictal) and increased WOB from metabolic acidosis and pneumonia Pneumonia > CAP/aspiration P:   - See ID - ABG and adjust vent accordingly. - Continue vent with high  RR. - Wean O2 based on results of ABG. - SBT in AM.  CARDIOVASCULAR A: Sinus tachycardia due to agitation and sepsis from pneumonia P:  - Bolus NS now and start TF. - Continuous NS at 150cc/hr. - Tele.  RENAL A:  AKI > presumably related to  sepsis Elevated anion gap metabolic acidosis> at this point most likely scenario is elevated lactic acid from post ictal state; consider ketosis or ingestion (but nothing by history); improving Hypercalcemia > uncertain etiology Hypokalemia > again, unclear etiology P: - Repeat BMET now. - Continue IVF. - Monitor UOP.  GASTROINTESTINAL A:  No acute issues P:   - OG tube - Pantoprazole for stress ulcer prophylaxis - TF.  HEMATOLOGIC A:  No acute issues P:  - Monitor for bleeding  INFECTIOUS A:  Pneumonia > community acquired, likely aspiration P:   - Resp culture - Follow blood culture - Ceftriaxone/azithro  ENDOCRINE A:  Hyperglycemia, DM2? P:   - ICU hyperglycemia protocol  NEUROLOGIC A:  Status epilepticus, non-compliance with keppra Encephalomalacia R frontal lobe, chronic, etiology? P:   - Neurology consult appreciated, would like to do continuous EEG. - Keppra - Continue propofol and fentanyl gtt for sedation titrated to RASS -2  TODAY'S SUMMARY: Neuro would like to keep patient intubated, will SBT in AM but no extubation until ok with neuro.  I have personally obtained a history, examined the patient, evaluated laboratory and imaging results, formulated the assessment and plan and placed orders.  CRITICAL CARE: The patient is critically ill with multiple organ systems failure and requires high complexity decision making for assessment and support, frequent evaluation and titration of therapies, application of advanced monitoring technologies and extensive interpretation of multiple databases. Critical Care Time devoted to patient care services described in this note is 35 minutes.   YACOUB,WESAM,MD Pulmonary and  Critical Care Medicine [facility] Pager: [phone]  01/18/2013, 1:46 PM

## 2013-01-18 NOTE — Consult Note (Signed)
Reason for Consult:Status epilepticus Referring Physician: Tyson Alias  CC: Status epilepticus  HPI: Katherine Simmons is an 27 y.o. female with a history of seizures since childhood.  Has been seizure free for at least 2 years.  Due to not having seizures and becoming pregnant the patient went off medications about a year ago and has done well since.  The patient was previously on Keppra.   Patient began to have seizures at home yesterday afternoon.  Presented to the ED where she continued to have generalized seizures.  The patient required multiple doses of Ativan and was loaded with Keppra.  Seizure activity abated but patient required intubation for respiratory purposes.   Per report of boyfriend patient has been having headaches recently.  Past Medical History  Diagnosis Date  . Scabies   . Seizure last 07/2009    Past Surgical History  Procedure Laterality Date  . No past surgeries      Family History  Problem Relation Age of Onset  . Hypertension Mother   . Cancer Mother     Social History:  reports that she has been smoking Cigarettes.  She has been smoking about 0.50 packs per day. She does not have any smokeless tobacco history on file. She reports that she drinks about 0.5 ounces of alcohol per week. She reports that she does not use illicit drugs.  No Known Allergies  Medications:  I have reviewed the patient's current medications. Prior to Admission: None                      Scheduled: . azithromycin (ZITHROMAX) 500 MG IVPB  500 mg Intravenous Once  . cefTRIAXone (ROCEPHIN)  IV  1 g Intravenous Q24H  . enoxaparin (LOVENOX) injection  40 mg Subcutaneous Q24H  . insulin aspart  2-6 Units Subcutaneous Q4H  . levETIRAcetam  500 mg Intravenous Q12H  . lidocaine (cardiac) 100 mg/44ml      . pantoprazole (PROTONIX) IV  40 mg Intravenous QHS  . propofol      . rocuronium        ROS: Unable to obtain  Physical Examination: Blood pressure 129/74, pulse 121,  temperature 99.1 F (37.3 C), temperature source Axillary, resp. rate 25, weight 130 kg (286 lb 9.6 oz), SpO2 100.00%, unknown if currently breastfeeding.  Neurologic Examination-patient on Fentanyl Mental Status: Patient does not respond to verbal stimuli.  Moves some to deep sternal rub but does not fully alert.  Does not follow commands.  No verbalizations noted.  Cranial Nerves: II: patient does not respond confrontation bilaterally, pupils right 2 mm, left 2 mm,and minimally reactive bilaterally III,IV,VI: doll's response absent bilaterally.  V,VII: corneal reflex absent bilaterally  VIII: patient does not respond to verbal stimuli IX,X: gag reflex absent, XI: trapezius strength unable to test bilaterally XII: tongue strength unable to test Motor: Extremities flaccid throughout.  No spontaneous movement noted.  Some movement noted in all extremities in response to sternal rub Sensory: Does not respond to noxious stimuli in any extremity. Deep Tendon Reflexes:  Absent throughout. Plantars: mute bilaterally Cerebellar: Unable to perform    Laboratory Studies:   Basic Metabolic Panel:  Recent Labs Lab 01/17/13 2040 01/18/13 0420  NA 146* 138  K 3.4* 4.1  CL 100 109  CO2 <7* 16*  GLUCOSE 237* 115*  BUN 11 8  CREATININE 1.31* 1.14*  CALCIUM 11.1* 7.9*  MG  --  2.5  PHOS  --  2.2*    Liver  Function Tests: No results found for this basename: AST, ALT, ALKPHOS, BILITOT, PROT, ALBUMIN,  in the last 168 hours  Recent Labs Lab 01/18/13 0420  LIPASE 19   No results found for this basename: AMMONIA,  in the last 168 hours  CBC:  Recent Labs Lab 01/17/13 2040 01/18/13 0420  WBC 30.0* 25.5*  NEUTROABS 15.0*  --   HGB 14.1 11.8*  HCT 44.5 33.9*  MCV 98.5 89.0  PLT 386 282    Cardiac Enzymes: No results found for this basename: CKTOTAL, CKMB, CKMBINDEX, TROPONINI,  in the last 168 hours  BNP: No components found with this basename: POCBNP,    CBG:  Recent Labs Lab 01/17/13 2029 01/18/13 0117 01/18/13 0401  GLUCAP 255* 118* 93    Microbiology: Results for orders placed during the hospital encounter of 01/17/13  CULTURE, BLOOD (ROUTINE X 2)     Status: None   Collection Time    01/18/13 12:17 AM      Result Value Range Status   Specimen Description BLOOD RIGHT ANTECUBITAL   Final   Special Requests BOTTLES DRAWN AEROBIC AND ANAEROBIC 5CC EACH   Final   Culture PENDING   Incomplete   Report Status PENDING   Incomplete  CULTURE, BLOOD (ROUTINE X 2)     Status: None   Collection Time    01/18/13 12:17 AM      Result Value Range Status   Specimen Description BLOOD LEFT ANTECUBITAL   Final   Special Requests     Final   Value: BOTTLES DRAWN AEROBIC AND ANAEROBIC AEB 4CC ANA 6CC   Culture PENDING   Incomplete   Report Status PENDING   Incomplete  MRSA PCR SCREENING     Status: None   Collection Time    01/18/13  2:15 AM      Result Value Range Status   MRSA by PCR NEGATIVE  NEGATIVE Final   Comment:            The GeneXpert MRSA Assay (FDA     approved for NASAL specimens     only), is one component of a     comprehensive MRSA colonization     surveillance program. It is not     intended to diagnose MRSA     infection nor to guide or     monitor treatment for     MRSA infections.    Coagulation Studies: No results found for this basename: LABPROT, INR,  in the last 72 hours  Urinalysis:  Recent Labs Lab 01/17/13 2127  COLORURINE YELLOW  LABSPEC >1.030*  PHURINE 5.5  GLUCOSEU NEGATIVE  HGBUR LARGE*  BILIRUBINUR NEGATIVE  KETONESUR NEGATIVE  PROTEINUR 100*  UROBILINOGEN 0.2  NITRITE NEGATIVE  LEUKOCYTESUR NEGATIVE    Lipid Panel:  No results found for this basename: chol, trig, hdl, cholhdl, vldl, ldlcalc    HgbA1C:  No results found for this basename: HGBA1C    Urine Drug Screen:     Component Value Date/Time   LABOPIA NONE DETECTED 01/17/2013 2127   COCAINSCRNUR NONE DETECTED  01/17/2013 2127   LABBENZ NONE DETECTED 01/17/2013 2127   AMPHETMU NONE DETECTED 01/17/2013 2127   THCU NONE DETECTED 01/17/2013 2127   LABBARB NONE DETECTED 01/17/2013 2127    Alcohol Level:  Recent Labs Lab 01/17/13 2040  ETH <11    Imaging: Ct Head Wo Contrast  01/17/2013   *RADIOLOGY REPORT*  Clinical Data:  Seizures  CT HEAD WITHOUT CONTRAST CT CERVICAL SPINE WITHOUT  CONTRAST  Technique:  Multidetector CT imaging of the head and cervical spine was performed following the standard protocol without intravenous contrast.  Multiplanar CT image reconstructions of the cervical spine were also generated.  Comparison:  09/04/2009 head CT, c-spine CT 07/29/2009  CT HEAD  Findings: Examination is degraded by patient motion.  Right frontal encephalomalacia again noted.  Left frontal scalp swelling. No acute hemorrhage, acute infarction, or mass lesion is seen.  No midline shift.  No skull fracture.  Orbits and paranasal sinuses grossly unremarkable.  IMPRESSION: Left frontal scalp swelling but no acute intracranial finding.  Stable right frontal encephalomalacia.  CT CERVICAL SPINE  Findings: Examination is degraded by patient motion. Fine detail is obscured by patient body habitus.  These factors markedly reduced the sensitivity for detection of fracture or other osseous abnormality. C1 through the cervical thoracic junction is visualized in its entirety. No precervical soft tissue widening is present.  Alignment grossly within normal limits.  No gross evidence for displaced fracture or dislocation.  IMPRESSION: Markedly suboptimal exam due to patient motion and body habitus. No gross evidence for acute cervical spine abnormality.   Original Report Authenticated By: Christiana Pellant, M.D.   Ct Cervical Spine Wo Contrast  01/17/2013   *RADIOLOGY REPORT*  Clinical Data:  Seizures  CT HEAD WITHOUT CONTRAST CT CERVICAL SPINE WITHOUT CONTRAST  Technique:  Multidetector CT imaging of the head and cervical spine was  performed following the standard protocol without intravenous contrast.  Multiplanar CT image reconstructions of the cervical spine were also generated.  Comparison:  09/04/2009 head CT, c-spine CT 07/29/2009  CT HEAD  Findings: Examination is degraded by patient motion.  Right frontal encephalomalacia again noted.  Left frontal scalp swelling. No acute hemorrhage, acute infarction, or mass lesion is seen.  No midline shift.  No skull fracture.  Orbits and paranasal sinuses grossly unremarkable.  IMPRESSION: Left frontal scalp swelling but no acute intracranial finding.  Stable right frontal encephalomalacia.  CT CERVICAL SPINE  Findings: Examination is degraded by patient motion. Fine detail is obscured by patient body habitus.  These factors markedly reduced the sensitivity for detection of fracture or other osseous abnormality. C1 through the cervical thoracic junction is visualized in its entirety. No precervical soft tissue widening is present.  Alignment grossly within normal limits.  No gross evidence for displaced fracture or dislocation.  IMPRESSION: Markedly suboptimal exam due to patient motion and body habitus. No gross evidence for acute cervical spine abnormality.   Original Report Authenticated By: Christiana Pellant, M.D.   Dg Chest Port 1 View  01/18/2013   *RADIOLOGY REPORT*  Clinical Data: Evaluate tube placement.  PORTABLE CHEST - 1 VIEW  Comparison: Chest x-ray 01/17/2013.  Findings: An endotracheal tube is in place with tip 2.4 cm above the carina. A nasogastric tube is seen extending into the stomach, however, the tip of the nasogastric tube extends below the lower margin of the image.  Lung volumes are low.  No acute consolidative air space disease.  Opacities in the right upper lobe again noted, likely reflect a combination of resolving atelectasis and air space consolidation.  Lungs are otherwise clear.  No pleural effusions. No evidence of pulmonary edema.  Mild cardiomegaly. The patient is  rotated to the right on today's exam, resulting in distortion of the mediastinal contours and reduced diagnostic sensitivity and specificity for mediastinal pathology.  IMPRESSION: 1.  Support apparatus, as above. 2.  Improving right upper lobe aeration may reflect resolving atelectasis and/or consolidation.  3.  Mild cardiomegaly.   Original Report Authenticated By: Trudie Reed, M.D.   Dg Chest Portable 1 View  01/17/2013   *RADIOLOGY REPORT*  Clinical Data: Shortness of breath  PORTABLE CHEST - 1 VIEW  Comparison: 09/04/2009  Findings: The cardiac shadow is stable.  There is significant right upper lobe infiltrate with volume loss identified.  The left lung is well aerated. Increased perihilar changes are noted as well.   IMPRESSION: Right upper lobe infiltrate.   Original Report Authenticated By: Alcide Clever, M.D.   Dg Chest Port 1v Same Day  01/17/2013   *RADIOLOGY REPORT*  Clinical Data: ET tube placement.  PORTABLE CHEST - 1 VIEW SAME DAY  Comparison: Chest x-ray from earlier the same day.  Findings: ETT enters the proximal right mainstem bronchus.  Gastric suction tube reaches the stomach.  Improved right lung aeration, although there is persistent right upper lobe atelectasis.  Worsening left lung aeration, with hazy opacification.  Partly imaged heart.  No evident pneumothorax, although the left chest is incompletely imaged.  These results were called by telephone on 01/17/2013 at 2351 hours to Dr Bebe Shaggy, who verbally acknowledged these results.  IMPRESSION:  1.  Incomplete imaging of the left chest. 2.  Low endotracheal tube, ending in the right mainstem bronchus. Recommend retraction by 2-3 cm. 3.  Improved right upper lobe aeration.  Worsening left lung aeration.   Original Report Authenticated By: Tiburcio Pea     Assessment/Plan: 27 year old female with a history of seizures off medication for the past year presents with status epilepticus.  Has been seizure free for at least 2 years.   Now placed back on Keppra.  Intubated.  CT of the head reviewed and shows ana rea of encephalomalacia in the right frontal lobe. Past imaging has been reviewed and this abnormality has been present at least since 2009 when her first head CT here was performed but suspect this is her seizure focus and has been present for much longer.    Recommendations: 1.  EEG to rule out nonconvulsive status 2.  Continue seizure precautions 3.  Continue Keppra at current dose 4.  Critical care addressing pneumonia  Thana Farr, MD [facility] [phone] 01/18/2013, 5:32 AM

## 2013-01-18 NOTE — Progress Notes (Signed)
INITIAL NUTRITION ASSESSMENT  DOCUMENTATION CODES Per approved criteria  -Morbid Obesity   INTERVENTION:  Initiate TF via OGT with Jevity 1.2 at 10 ml/h, goal rate, with Prostat 30 ml QID to provide 688 kcals, 73 gm protein, 194 ml free water daily.  TF + Propofol will provide a total of 1741 kcals (70% of estimated nutrition needs).  Unable to meet protein needs at this time due to high rate of Propofol.  NUTRITION DIAGNOSIS: Inadequate oral intake related to inability to eat as evidenced by NPO status.   Goal: Enteral nutrition to provide 60-70% of estimated calorie needs (22-25 kcals/kg ideal body weight) and 100% of estimated protein needs, based on ASPEN guidelines for permissive underfeeding in critically ill obese individuals.  Monitor:  TF tolerance/adequacy, weight trend, labs, vent status.  Reason for Assessment: MD Consult for TF initiation and management.  27 y.o. female  Admitting Dx: Status epilepticus  ASSESSMENT: Patient was admitted on 8/15 from the [facility] with status epilepticus, pneumonia, and acute respiratory failure requiring intubation. Abbreviated physical exam completed.  No muscle or subcutaneous fat depletion noticed.  Patient is currently intubated on ventilator support.  MV: 10.4 L/min Temp:Temp (24hrs), Avg:99.4 F (37.4 C), Min:98.8 F (37.1 C), Max:101 F (38.3 C)  Propofol: 39.2 ml/hr providing 1035 kcals/day.   Height: Ht Readings from Last 1 Encounters:  02/17/12 5\' 6"  (1.676 m)    Weight: Wt Readings from Last 1 Encounters:  01/18/13 286 lb 9.6 oz (130 kg)    Ideal Body Weight: 59.1 kg  % Ideal Body Weight: 220%  Wt Readings from Last 10 Encounters:  01/18/13 286 lb 9.6 oz (130 kg)  02/17/12 288 lb (130.636 kg)  02/17/12 288 lb (130.636 kg)  12/12/10 260 lb (117.935 kg)    Usual Body Weight: 288 lb  % Usual Body Weight: 99%  BMI:  Body mass index is 46.28 kg/(m^2). class 3, extreme/morbid obesity  Estimated  Nutritional Needs: Kcal: 2475 Protein: 118-148 gm Fluid: 2.4-2.6 L  Skin: no problems  Diet Order: NPO  EDUCATION NEEDS: -Education not appropriate at this time   Intake/Output Summary (Last 24 hours) at 01/18/13 1256 Last data filed at 01/18/13 1200  Gross per 24 hour  Intake 2165.93 ml  Output   1520 ml  Net 645.93 ml    Last BM: none documented   Labs:   Recent Labs Lab 01/17/13 2040 01/18/13 0420  NA 146* 138  K 3.4* 4.1  CL 100 109  CO2 <7* 16*  BUN 11 8  CREATININE 1.31* 1.14*  CALCIUM 11.1* 7.9*  MG  --  2.5  PHOS  --  2.2*  GLUCOSE 237* 115*    CBG (last 3)   Recent Labs  01/18/13 0401 01/18/13 0818 01/18/13 1137  GLUCAP 93 102* 122*    Scheduled Meds: . antiseptic oral rinse  15 mL Mouth Rinse QID  . azithromycin (ZITHROMAX) 500 MG IVPB  500 mg Intravenous Once  . azithromycin  500 mg Intravenous Q24H  . cefTRIAXone (ROCEPHIN)  IV  1 g Intravenous Q24H  . chlorhexidine  15 mL Mouth Rinse BID  . enoxaparin (LOVENOX) injection  40 mg Subcutaneous Q24H  . insulin aspart  2-6 Units Subcutaneous Q4H  . levETIRAcetam  500 mg Intravenous Q12H  . pantoprazole (PROTONIX) IV  40 mg Intravenous QHS    Continuous Infusions: . sodium chloride 150 mL/hr at 01/18/13 1200  . fentaNYL infusion INTRAVENOUS 100 mcg/hr (01/18/13 1200)  . propofol 50 mcg/kg/min (01/18/13  1200)    Past Medical History  Diagnosis Date  . Scabies   . Seizure last 07/2009    Past Surgical History  Procedure Laterality Date  . No past surgeries      Joaquin Courts, RD, LDN, CNSC Pager [phone] After Hours Pager [phone]

## 2013-01-19 ENCOUNTER — Inpatient Hospital Stay (HOSPITAL_COMMUNITY): Payer: Medicaid Other

## 2013-01-19 DIAGNOSIS — N179 Acute kidney failure, unspecified: Secondary | ICD-10-CM

## 2013-01-19 DIAGNOSIS — G40401 Other generalized epilepsy and epileptic syndromes, not intractable, with status epilepticus: Secondary | ICD-10-CM

## 2013-01-19 DIAGNOSIS — J9600 Acute respiratory failure, unspecified whether with hypoxia or hypercapnia: Secondary | ICD-10-CM

## 2013-01-19 LAB — BASIC METABOLIC PANEL
Calcium: 8.4 mg/dL (ref 8.4–10.5)
Creatinine, Ser: 1.72 mg/dL — ABNORMAL HIGH (ref 0.50–1.10)
GFR calc Af Amer: 46 mL/min — ABNORMAL LOW (ref >90)
Sodium: 140 mEq/L (ref 135–145)

## 2013-01-19 LAB — CBC
MCH: 30.7 pg (ref 26.0–34.0)
MCV: 89.8 fL (ref 78.0–100.0)
Platelets: 256 10*3/uL (ref 150–400)
RBC: 3.74 MIL/uL — ABNORMAL LOW (ref 3.87–5.11)
RDW: 14.1 % (ref 11.5–15.5)
WBC: 18.7 10*3/uL — ABNORMAL HIGH (ref 4.0–10.5)

## 2013-01-19 LAB — MAGNESIUM: Magnesium: 2.1 mg/dL (ref 1.5–2.5)

## 2013-01-19 LAB — URINE CULTURE: Colony Count: NO GROWTH

## 2013-01-19 LAB — BLOOD GAS, ARTERIAL
Bicarbonate: 17.3 mEq/L — ABNORMAL LOW (ref 20.0–24.0)
PEEP: 5 cmH2O
TCO2: 18.4 mmol/L (ref 0–100)
pCO2 arterial: 35.3 mmHg (ref 35.0–45.0)
pH, Arterial: 7.312 — ABNORMAL LOW (ref 7.350–7.450)
pO2, Arterial: 75.7 mmHg — ABNORMAL LOW (ref 80.0–100.0)

## 2013-01-19 LAB — GLUCOSE, CAPILLARY: Glucose-Capillary: 98 mg/dL (ref 70–99)

## 2013-01-19 LAB — PHOSPHORUS: Phosphorus: 2.8 mg/dL (ref 2.3–4.6)

## 2013-01-19 MED ORDER — ACETAMINOPHEN 325 MG PO TABS
650.0000 mg | ORAL_TABLET | Freq: Four times a day (QID) | ORAL | Status: DC | PRN
  Administered 2013-01-19: 650 mg via ORAL
  Filled 2013-01-19: qty 2

## 2013-01-19 MED ORDER — ALBUTEROL SULFATE (5 MG/ML) 0.5% IN NEBU
2.5000 mg | INHALATION_SOLUTION | RESPIRATORY_TRACT | Status: DC | PRN
  Filled 2013-01-19 (×3): qty 0.5

## 2013-01-19 MED ORDER — ALBUTEROL SULFATE (5 MG/ML) 0.5% IN NEBU
2.5000 mg | INHALATION_SOLUTION | Freq: Four times a day (QID) | RESPIRATORY_TRACT | Status: DC
  Administered 2013-01-19 – 2013-01-21 (×8): 2.5 mg via RESPIRATORY_TRACT
  Filled 2013-01-19 (×5): qty 0.5

## 2013-01-19 MED ORDER — MENTHOL 3 MG MT LOZG
1.0000 | LOZENGE | OROMUCOSAL | Status: DC | PRN
  Filled 2013-01-19: qty 9

## 2013-01-19 MED ORDER — FENTANYL CITRATE 0.05 MG/ML IJ SOLN
12.5000 ug | INTRAMUSCULAR | Status: DC | PRN
  Administered 2013-01-19: 12.5 ug via INTRAVENOUS
  Filled 2013-01-19: qty 2

## 2013-01-19 NOTE — Progress Notes (Signed)
PULMONARY  / CRITICAL CARE MEDICINE  Name: Katherine Simmons MRN: [id number] DOB: July 26, 1985    ADMISSION DATE:  01/17/2013 CONSULTATION DATE:  01/17/2013  REFERRING MD :  Bebe Shaggy [facility] ED PRIMARY SERVICE: PCCM  CHIEF COMPLAINT:  Status epilepticus  BRIEF PATIENT DESCRIPTION: 27 y/o female with known epilepsy was admitted on 8/15 from [facility] with status epilepticus, pneumonia, and acute respiratory failure requiring intubation.  SIGNIFICANT EVENTS / STUDIES:  8/15 CT head > Left frontal scalp swelling, no acute intracranial finding; stable encephalomalacia 8/15 C spine > limited study, no gross evidence for C-spine abnormality  LINES / TUBES: 8/14 ETT >> 8/16  CULTURES: 8/15 blood >>  8/15 urine >>  8/15 resp >>   ANTIBIOTICS: 8/15 ceftriaxone >>  8/15 azithro >>   SUBJECTIVE:  Passed SBT. RASS 0. + F/C. Extubated and looks good  VITAL SIGNS: Temp:  [97.8 F (36.6 C)-100.8 F (38.2 C)] 100.8 F (38.2 C) (08/16 1213) Pulse Rate:  [95-124] 122 (08/16 1400) Resp:  [18-46] 35 (08/16 1400) BP: (102-133)/(60-101) 125/69 mmHg (08/16 1400) SpO2:  [96 %-100 %] 98 % (08/16 1404) FiO2 (%):  [40 %] 40 % (08/16 1000) HEMODYNAMICS:   VENTILATOR SETTINGS: Vent Mode:  [-] CPAP FiO2 (%):  [40 %] 40 % Set Rate:  [16 bmp-22 bmp] 16 bmp Vt Set:  [460 mL] 460 mL PEEP:  [5 cmH20] 5 cmH20 Plateau Pressure:  [23 cmH20-26 cmH20] 25 cmH20 INTAKE / OUTPUT: Intake/Output     08/15 0701 - 08/16 0700 08/16 0701 - 08/17 0700   I.V. (mL/kg) 3857.4 (29.7) 150 (1.2)   NG/GT 160 30   IV Piggyback 355 350   Total Intake(mL/kg) 4372.4 (33.6) 530 (4.1)   Urine (mL/kg/hr) 1520 (0.5) 980 (0.9)   Emesis/NG output 100 (0)    Total Output 1620 980   Net +2752.4 -450          PHYSICAL EXAMINATION:  Gen: NAD, RASS 0 HEENT: WNL PULM: bilat wheezes CV: RRR s M AB: BS+, soft, nontender, no hsm Ext: warm, trace edema legs, no clubbing, no cyanosis Neuro: No focal deficits, cognition  intact   LABS:  CBC Recent Labs     01/17/13  2040  01/18/13  0420  01/19/13  1010  WBC  30.0*  25.5*  18.7*  HGB  14.1  11.8*  11.5*  HCT  44.5  33.9*  33.6*  PLT  386  282  256   Coag's No results found for this basename: APTT, INR,  in the last 72 hours BMET Recent Labs     01/17/13  2040  01/18/13  0420  01/19/13  1010  NA  146*  138  140  K  3.4*  4.1  3.7  CL  100  109  113*  CO2  <7*  16*  17*  BUN  11  8  10   CREATININE  1.31*  1.14*  1.72*  GLUCOSE  237*  115*  109*   Electrolytes Recent Labs     01/17/13  2040  01/18/13  0420  01/19/13  1010  CALCIUM  11.1*  7.9*  8.4  MG   --   2.5  2.1  PHOS   --   2.2*  2.8   Sepsis Markers Recent Labs     01/18/13  0420  PROCALCITON  3.84   ABG Recent Labs     01/17/13  2325  01/18/13  0320  01/19/13  0402  PHART  7.220*  7.326*  7.312*  PCO2ART  37.6  32.0*  35.3  PO2ART  112.0*  294.0*  75.7*   Liver Enzymes No results found for this basename: AST, ALT, ALKPHOS, BILITOT, ALBUMIN,  in the last 72 hours Cardiac Enzymes No results found for this basename: TROPONINI, PROBNP,  in the last 72 hours Glucose Recent Labs     01/18/13  0818  01/18/13  1137  01/18/13  1609  01/18/13  2002  01/19/13  0001  01/19/13  0754  GLUCAP  102*  122*  73  110*  98  85     CXR: low volumes, CM, RUL opacity  ASSESSMENT / PLAN:  PULMONARY A: Acute respiratory failure due to inability protect airway, resolved Prob CAP Acute wheezing P:   BDs ordered Extubated - monitor in ICU and progress as tolerated  CARDIOVASCULAR A: Sinus tachycardia, resolved P:  Monitor  RENAL A:  AKI, etiology unclear Met acidosis, now nongap Hypercalcemia, spurious, resolved Hypokalemia, resolved P: Monitor BMET intermittently Correct electrolytes as indicated  GASTROINTESTINAL A:  No acute issues P:   NPO after extubation Advance diet as tolerated if no problems in 4 hrs post ext  HEMATOLOGIC A:  No acute  issues P:  - Monitor for bleeding  INFECTIOUS A:  Suspected CAP Elevated PCT P:   Micro and abx as above  ENDOCRINE A:  Hyperglycemia, transient No hx of DM P:   D/C ICU hyperglycemia protocol  NEUROLOGIC A:  Status epilepticus, non-compliance with keppra Encephalomalacia R frontal lobe, chronic, etiology unclear P:   AEDs per Neuro  CCM X 30 mins  Billy Fischer, MD ; [facility] service Mobile [phone].  After 5:30 PM or weekends, call [phone]

## 2013-01-19 NOTE — Progress Notes (Signed)
Fentanyl 250 ml bag wasted. 10 mcg/ 1 ml concentration. Wasted 110 ml or 1100 mcg in sink. Witnessed by E. Viona Gilmore, RN

## 2013-01-19 NOTE — Progress Notes (Signed)
SBT passed pt weaned for 1 hour 10 minutes on cpap pressure support 5/5.Positive cuff leak. Pt extubated per md order to 4 L nasal cannula. Incentive spirometer instructed.

## 2013-01-19 NOTE — Progress Notes (Signed)
NEURO HOSPITALIST PROGRESS NOTE   SUBJECTIVE:                                                                                                                        Sedated with propofol + fentanyl, intubated on the vent. No further seizures noted. EEG revealed no electrographic seizures. Patient nurse reports that she follows some commands.  On IV keppra 500 mg BID.  OBJECTIVE:                                                                                                                           Vital signs in last 24 hours: Temp:  [97.8 F (36.6 C)-99.5 F (37.5 C)] 97.8 F (36.6 C) (08/16 0100) Pulse Rate:  [95-118] 101 (08/16 0700) Resp:  [18-46] 27 (08/16 0700) BP: (102-133)/(60-101) 121/77 mmHg (08/16 0700) SpO2:  [96 %-100 %] 100 % (08/16 0700) FiO2 (%):  [40 %-50 %] 40 % (08/16 0747)  Intake/Output from previous day: 08/15 0701 - 08/16 0700 In: 4287.4 [I.V.:3782.4; NG/GT:150; IV Piggyback:355] Out: 1620 [Urine:1520; Emesis/NG output:100] Intake/Output this shift:   Nutritional status: NPO  Past Medical History  Diagnosis Date  . Scabies   . Seizure last 07/2009    Neurologic Exam:  Limited due to sedation. Mental status: sedated. CN 2-12: pupils 1-2 mm bilaterally reactive. No gaze preference. EOM full without nystagmus. Face seems to be symmetric. Tongue: intubated. Motor: no spontaneous movements but on heavy sedation. Sensory: unreliable due to heavy sedation. Coordination and gait: unable to test.   Lab Results: No results found for this basename: cbc, bmp, coags, chol, tri, ldl, hga1c   Lipid Panel No results found for this basename: CHOL, TRIG, HDL, CHOLHDL, VLDL, LDLCALC,  in the last 72 hours  Studies/Results: Ct Head Wo Contrast  01/17/2013   *RADIOLOGY REPORT*  Clinical Data:  Seizures  CT HEAD WITHOUT CONTRAST CT CERVICAL SPINE WITHOUT CONTRAST  Technique:  Multidetector CT imaging of the head and  cervical spine was performed following the standard protocol without intravenous contrast.  Multiplanar CT image reconstructions of the cervical spine were also generated.  Comparison:  09/04/2009 head CT, c-spine CT 07/29/2009  CT HEAD  Findings: Examination is degraded by patient motion.  Right  frontal encephalomalacia again noted.  Left frontal scalp swelling. No acute hemorrhage, acute infarction, or mass lesion is seen.  No midline shift.  No skull fracture.  Orbits and paranasal sinuses grossly unremarkable.  IMPRESSION: Left frontal scalp swelling but no acute intracranial finding.  Stable right frontal encephalomalacia.  CT CERVICAL SPINE  Findings: Examination is degraded by patient motion. Fine detail is obscured by patient body habitus.  These factors markedly reduced the sensitivity for detection of fracture or other osseous abnormality. C1 through the cervical thoracic junction is visualized in its entirety. No precervical soft tissue widening is present.  Alignment grossly within normal limits.  No gross evidence for displaced fracture or dislocation.  IMPRESSION: Markedly suboptimal exam due to patient motion and body habitus. No gross evidence for acute cervical spine abnormality.   Original Report Authenticated By: Christiana Pellant, M.D.   Ct Cervical Spine Wo Contrast  01/17/2013   *RADIOLOGY REPORT*  Clinical Data:  Seizures  CT HEAD WITHOUT CONTRAST CT CERVICAL SPINE WITHOUT CONTRAST  Technique:  Multidetector CT imaging of the head and cervical spine was performed following the standard protocol without intravenous contrast.  Multiplanar CT image reconstructions of the cervical spine were also generated.  Comparison:  09/04/2009 head CT, c-spine CT 07/29/2009  CT HEAD  Findings: Examination is degraded by patient motion.  Right frontal encephalomalacia again noted.  Left frontal scalp swelling. No acute hemorrhage, acute infarction, or mass lesion is seen.  No midline shift.  No skull fracture.   Orbits and paranasal sinuses grossly unremarkable.  IMPRESSION: Left frontal scalp swelling but no acute intracranial finding.  Stable right frontal encephalomalacia.  CT CERVICAL SPINE  Findings: Examination is degraded by patient motion. Fine detail is obscured by patient body habitus.  These factors markedly reduced the sensitivity for detection of fracture or other osseous abnormality. C1 through the cervical thoracic junction is visualized in its entirety. No precervical soft tissue widening is present.  Alignment grossly within normal limits.  No gross evidence for displaced fracture or dislocation.  IMPRESSION: Markedly suboptimal exam due to patient motion and body habitus. No gross evidence for acute cervical spine abnormality.   Original Report Authenticated By: Christiana Pellant, M.D.   Dg Chest Port 1 View  01/18/2013   *RADIOLOGY REPORT*  Clinical Data: Evaluate tube placement.  PORTABLE CHEST - 1 VIEW  Comparison: Chest x-ray 01/17/2013.  Findings: An endotracheal tube is in place with tip 2.4 cm above the carina. A nasogastric tube is seen extending into the stomach, however, the tip of the nasogastric tube extends below the lower margin of the image.  Lung volumes are low.  No acute consolidative air space disease.  Opacities in the right upper lobe again noted, likely reflect a combination of resolving atelectasis and air space consolidation.  Lungs are otherwise clear.  No pleural effusions. No evidence of pulmonary edema.  Mild cardiomegaly. The patient is rotated to the right on today's exam, resulting in distortion of the mediastinal contours and reduced diagnostic sensitivity and specificity for mediastinal pathology.  IMPRESSION: 1.  Support apparatus, as above. 2.  Improving right upper lobe aeration may reflect resolving atelectasis and/or consolidation. 3.  Mild cardiomegaly.   Original Report Authenticated By: Trudie Reed, M.D.   Dg Chest Portable 1 View  01/17/2013   *RADIOLOGY  REPORT*  Clinical Data: Shortness of breath  PORTABLE CHEST - 1 VIEW  Comparison: 09/04/2009  Findings: The cardiac shadow is stable.  There is significant right upper lobe infiltrate  with volume loss identified.  The left lung is well aerated. Increased perihilar changes are noted as well.   IMPRESSION: Right upper lobe infiltrate.   Original Report Authenticated By: Alcide Clever, M.D.   Dg Chest Port 1v Same Day  01/17/2013   *RADIOLOGY REPORT*  Clinical Data: ET tube placement.  PORTABLE CHEST - 1 VIEW SAME DAY  Comparison: Chest x-ray from earlier the same day.  Findings: ETT enters the proximal right mainstem bronchus.  Gastric suction tube reaches the stomach.  Improved right lung aeration, although there is persistent right upper lobe atelectasis.  Worsening left lung aeration, with hazy opacification.  Partly imaged heart.  No evident pneumothorax, although the left chest is incompletely imaged.  These results were called by telephone on 01/17/2013 at 2351 hours to Dr Bebe Shaggy, who verbally acknowledged these results.  IMPRESSION:  1.  Incomplete imaging of the left chest. 2.  Low endotracheal tube, ending in the right mainstem bronchus. Recommend retraction by 2-3 cm. 3.  Improved right upper lobe aeration.  Worsening left lung aeration.   Original Report Authenticated By: Tiburcio Pea    MEDICATIONS                                                                                                                       I have reviewed the patient's current medications.  ASSESSMENT/PLAN:                                                                                                            GTC SE resolved. No subtle signs on neuro-exam to suggest NCSE. Plan to maintain current AED, wean off sedatives, and try to extubate today. Will follow up. Wyatt Portela, MD [facility] [phone]  01/19/2013, 8:41 AM

## 2013-01-20 MED ORDER — LEVETIRACETAM 500 MG PO TABS
500.0000 mg | ORAL_TABLET | Freq: Two times a day (BID) | ORAL | Status: DC
  Administered 2013-01-20 – 2013-01-21 (×2): 500 mg via ORAL
  Filled 2013-01-20 (×4): qty 1

## 2013-01-20 MED ORDER — LEVOFLOXACIN 500 MG PO TABS
500.0000 mg | ORAL_TABLET | Freq: Every day | ORAL | Status: DC
  Administered 2013-01-20 – 2013-01-21 (×2): 500 mg via ORAL
  Filled 2013-01-20 (×2): qty 1

## 2013-01-20 NOTE — Progress Notes (Signed)
Pt arrived to unit via wheelchair. Pt alert and oriented x4. Ambulatory. Denies any pain. Some mild tongue pain. No skin issues. Vital signs stable. Oriented to the unit and staff. Will cont to monitor.

## 2013-01-20 NOTE — Progress Notes (Signed)
PULMONARY  / CRITICAL CARE MEDICINE  Name: Katherine Simmons MRN: [id number] DOB: 1986/02/28    ADMISSION DATE:  01/17/2013 CONSULTATION DATE:  01/17/2013  REFERRING MD :  Bebe Shaggy [facility] PRIMARY SERVICE: PCCM  CHIEF COMPLAINT:  Status epilepticus  BRIEF PATIENT DESCRIPTION: 27 y/o female with known epilepsy was admitted on 8/15 [facility] with status epilepticus, pneumonia, and acute respiratory failure requiring intubation.  SIGNIFICANT EVENTS / STUDIES:  8/15 CT head > Left frontal scalp swelling, no acute intracranial finding; stable encephalomalacia 8/15 C spine > limited study, no gross evidence for C-spine abnormality  LINES / TUBES: 8/14 ETT >> 8/16  CULTURES: 8/15 urine >> NEG 8/15 blood >>  8/16 resp >>   ANTIBIOTICS: 8/15 ceftriaxone >> 8/17 8/15 azithro >> 8/17 Levoflox 8/17 >>   SUBJECTIVE:  No distress. No new complaints  VITAL SIGNS: Temp:  [97.4 F (36.3 C)-102.1 F (38.9 C)] 99 F (37.2 C) (08/17 1407) Pulse Rate:  [11-128] 108 (08/17 1407) Resp:  [20-42] 20 (08/17 1407) BP: (101-132)/(48-96) 118/65 mmHg (08/17 1407) SpO2:  [92 %-100 %] 98 % (08/17 1407) HEMODYNAMICS:   VENTILATOR SETTINGS:   INTAKE / OUTPUT: Intake/Output     08/16 0701 - 08/17 0700 08/17 0701 - 08/18 0700   P.O. 1320 360   I.V. (mL/kg) 150 (1.2)    NG/GT 30    IV Piggyback 550 350   Total Intake(mL/kg) 2050 (15.8) 710 (5.5)   Urine (mL/kg/hr) 2580 (0.8) 200 (0.2)   Emesis/NG output     Total Output 2580 200   Net -530 +510        Urine Occurrence 2 x 2 x     PHYSICAL EXAMINATION:  Gen: NAD HEENT: WNL PULM: clear  CV: RRR s M AB: BS+, soft, nontender, no hsm Ext: warm, trace edema legs, no clubbing, no cyanosis Neuro: No focal deficits, cognition intact   LABS:  CBC Recent Labs     01/17/13  2040  01/18/13  0420  01/19/13  1010  WBC  30.0*  25.5*  18.7*  HGB  14.1  11.8*  11.5*  HCT  44.5  33.9*  33.6*  PLT  386  282  256   Coag's No results  found for this basename: APTT, INR,  in the last 72 hours BMET Recent Labs     01/17/13  2040  01/18/13  0420  01/19/13  1010  NA  146*  138  140  K  3.4*  4.1  3.7  CL  100  109  113*  CO2  <7*  16*  17*  BUN  11  8  10   CREATININE  1.31*  1.14*  1.72*  GLUCOSE  237*  115*  109*   Electrolytes Recent Labs     01/17/13  2040  01/18/13  0420  01/19/13  1010  CALCIUM  11.1*  7.9*  8.4  MG   --   2.5  2.1  PHOS   --   2.2*  2.8   Sepsis Markers Recent Labs     01/18/13  0420  PROCALCITON  3.84   ABG Recent Labs     01/17/13  2325  01/18/13  0320  01/19/13  0402  PHART  7.220*  7.326*  7.312*  PCO2ART  37.6  32.0*  35.3  PO2ART  112.0*  294.0*  75.7*   Liver Enzymes No results found for this basename: AST, ALT, ALKPHOS, BILITOT, ALBUMIN,  in the last  72 hours Cardiac Enzymes No results found for this basename: TROPONINI, PROBNP,  in the last 72 hours Glucose Recent Labs     01/18/13  0818  01/18/13  1137  01/18/13  1609  01/18/13  2002  01/19/13  0001  01/19/13  0754  GLUCAP  102*  122*  73  110*  98  85     CXR: NNF  ASSESSMENT / PLAN:  PULMONARY A: Acute respiratory failure due to inability protect airway, resolved Prob CAP Acute wheezing P:   Cont BDs PA/lat CXR AM 8/18  CARDIOVASCULAR A: Sinus tachycardia, resolved P:  Monitor  RENAL A:  AKI, etiology unclear, nonoliguric Met acidosis Hypercalcemia, spurious, resolved Hypokalemia, resolved P: Recheck BMET AM 8/18  GASTROINTESTINAL A:  No acute issues P:   Cont current diet  HEMATOLOGIC A:  No acute issues P:  Monitor for bleeding  INFECTIOUS A:  Suspected CAP Elevated PCT P:   Micro and abx as above  ENDOCRINE A:  Hyperglycemia, transient No hx of DM P:   Off SSI now  NEUROLOGIC A:  Status epilepticus, non-compliance with keppra Encephalomalacia R frontal lobe, chronic, etiology unclear P:   AEDs per Neuro  Anticipate DC home 8/18 AM if renal function and  acidosis have improved  Billy Fischer, MD ; [facility] service Mobile [phone].  After 5:30 PM or weekends, call [phone]

## 2013-01-20 NOTE — Progress Notes (Signed)
NEURO HOSPITALIST PROGRESS NOTE   SUBJECTIVE:                                                                                                                        Extubated and feeling well. No further seizures noted. Stated that her seizures are typically preceded by " a strange feeling that is hard to describe". In fact, she said that she has been experiencing that feeling weeks before her admission to the hospital.  On keppra 500 mg IV Q12. Denies headache, vertigo, double vision, focal weakness or numbness, slurred speech, language or vision impairment.  OBJECTIVE:                                                                                                                           Vital signs in last 24 hours: Temp:  [97.4 F (36.3 C)-102.1 F (38.9 C)] 99.4 F (37.4 C) (08/17 0800) Pulse Rate:  [11-128] 110 (08/17 0800) Resp:  [25-42] 32 (08/17 0800) BP: (101-132)/(48-96) 121/72 mmHg (08/17 0800) SpO2:  [92 %-100 %] 97 % (08/17 0800) FiO2 (%):  [40 %] 40 % (08/16 1000)  Intake/Output from previous day: 08/16 0701 - 08/17 0700 In: 1950 [P.O.:1320; I.V.:150; NG/GT:30; IV Piggyback:450] Out: 2580 [Urine:2580] Intake/Output this shift:   Nutritional status: Cardiac  Past Medical History  Diagnosis Date  . Scabies   . Seizure last 07/2009      Neurologic Exam:  Mental Status: Alert, awake, oriented x 4, thought content appropriate.  Speech fluent without evidence of aphasia.  Able to follow 3 step commands without difficulty. Cranial Nerves: II: Discs flat bilaterally; Visual fields grossly normal, pupils equal, round, reactive to light and accommodation III,IV, VI: ptosis not present, extra-ocular motions intact bilaterally V,VII: smile symmetric, facial light touch sensation normal bilaterally VIII: hearing normal bilaterally IX,X: gag reflex present XI: bilateral shoulder shrug XII: midline tongue extension Motor: Right  : Upper extremity   5/5    Left:     Upper extremity   5/5  Lower extremity   5/5     Lower extremity   5/5 Tone and bulk:normal tone throughout; no atrophy noted Sensory: Pinprick and light touch intact throughout, bilaterally Deep Tendon Reflexes:  1+ all over  Plantars: Right: downgoing   Left: downgoing Cerebellar: normal finger-to-nose,  normal heel-to-shin test Gait: No tested. CV: pulses palpable throughout    Lab Results: No results found for this basename: cbc, bmp, coags, chol, tri, ldl, hga1c   Lipid Panel No results found for this basename: CHOL, TRIG, HDL, CHOLHDL, VLDL, LDLCALC,  in the last 72 hours  Studies/Results: Dg Chest Port 1 View  01/19/2013   *RADIOLOGY REPORT*  Clinical Data: Evaluate ET tube placement  PORTABLE CHEST - 1 VIEW  Comparison: 01/18/2013  Findings: The endotracheal tube tip lies 16 mm above the carina, well positioned without significant change from prior study allowing for differences in patient positioning.  Right upper lobe central opacity is similar to the prior exam, likely atelectasis.  The lungs otherwise clear.  No pleural effusion or pneumothorax.  The prep the cardiac silhouette is normal in size.  An enteric tube passes below the diaphragm well into the stomach, stable.  IMPRESSION: No change from previous day's study.  Support apparatus remains well positioned.  Right upper lobe central opacity is again noted, likely atelectasis.   Original Report Authenticated By: Amie Portland, M.D.    MEDICATIONS                                                                                                                       I have reviewed the patient's current medications.  ASSESSMENT/PLAN:                                                                                                           GTC SE resolved. Mental status intact at this moment. 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 She can be send out of the NICU today, if feasible Katherine Portela, MD [facility] [phone]  01/20/2013, 8:52 AM

## 2013-01-21 ENCOUNTER — Inpatient Hospital Stay (HOSPITAL_COMMUNITY): Payer: Medicaid Other

## 2013-01-21 DIAGNOSIS — J690 Pneumonitis due to inhalation of food and vomit: Secondary | ICD-10-CM

## 2013-01-21 LAB — BASIC METABOLIC PANEL
BUN: 10 mg/dL (ref 6–23)
Calcium: 9 mg/dL (ref 8.4–10.5)
Chloride: 110 mEq/L (ref 96–112)
Creatinine, Ser: 1 mg/dL (ref 0.50–1.10)
GFR calc Af Amer: 88 mL/min — ABNORMAL LOW (ref >90)

## 2013-01-21 LAB — PTH, INTACT AND CALCIUM: PTH: 170.1 pg/mL — ABNORMAL HIGH (ref 14.0–72.0)

## 2013-01-21 MED ORDER — LEVOFLOXACIN 500 MG PO TABS: 500.0000 mg | ORAL_TABLET | Freq: Every day | ORAL | Status: DC

## 2013-01-21 MED ORDER — LEVETIRACETAM 500 MG PO TABS: 250.0000 mg | ORAL_TABLET | Freq: Two times a day (BID) | ORAL | Status: DC

## 2013-01-21 NOTE — Progress Notes (Signed)
Pt signed d/c papers, IV removed. Instructions on importance of taking medications and driving/activity restrictions explained. Room checked for belongings, pt ambulatory off unit accompanied by significant other.

## 2013-01-21 NOTE — Progress Notes (Deleted)
Per MD request, paged PCCM for central access. Awaiting return call. Will continue to monitor.

## 2013-01-21 NOTE — Discharge Summary (Signed)
Physician Discharge Summary  Patient ID: Katherine Simmons MRN: [id number] DOB/AGE: 02/15/1986 27 y.o.  Admit date: 01/17/2013 Discharge date: 01/21/2013    Discharge Diagnoses:  Principal Problem:   Status epilepticus Active Problems:   Acute respiratory failure with hypoxia   Hyperglycemia   AKI (acute kidney injury)   Hypercalcemia    Brief Summary: Katherine Simmons is a 27 y.o. y/o female with a PMH of epilepsy was admitted on 8/15 from [facility] with status epilepticus, pneumonia, and acute respiratory failure requiring intubation.  SIGNIFICANT EVENTS / STUDIES:  8/15 CT head > Left frontal scalp swelling, no acute intracranial finding; stable encephalomalacia  8/15 C spine > limited study, no gross evidence for C-spine abnormality  8/15 EEG>> this is a normal EEG performed while the patient is heavily sedated with propofol and fentanyl. Please, be aware that a normal EEG does not exclude the possibility of epilepsy.    LINES / TUBES:  8/14 ETT >> 8/16   CULTURES:  8/15 urine >> NEG  8/15 blood >>  8/16 resp >>   ANTIBIOTICS:  8/15 ceftriaxone >> 8/17  8/15 azithro >> 8/17  Levoflox 8/17 >>8/22                                                                    Hospital Summary by Discharge Diagnosis  Status epilepticus -- followed closely by neurology.  Precipitated by medication noncompliance.  Keppra resumed as inpt at 500mg  BID per neuro recs. EEG done -as above.  he will f/u with neuro as outpt in 2 weeks. No further seizures. Medication compliance, driving restrictions etc discussed at length with patient. She was informed that according to [place] law she must remain seizure free for at least 6 months before resuming driving. Other seizure precautions ( no swimming alone, no operating heavy machinery, no climbing, no risky tasks) were explained to patient.  Acute respiratory failure - r/t inability to protect airway in setting status epilepticus.  Resolved.  C/b  presumed CAP.  PNA - presumed CAP v aspiration.  CXR improved. Rx with levaquin - will cont x 5 days total.  No further resp issues.  Cough much improved, sputum now clear.  Wheezing resolved.   Acute renal insufficiency - unclear etiology.  ?r/t SIRS in setting CAP. Non oliguric.  Much improved with gentle volume.  F/u per PCP.  Metabolic acidosis - resolved.  Thought r/t elevated lactate from post ictal state.      Filed Vitals:   01/21/13 0237 01/21/13 0500 01/21/13 0545 01/21/13 0826  BP:   106/65   Pulse:   94   Temp:   98.8 F (37.1 C)   TempSrc:   Oral   Resp:   20   Height:  5\' 5"  (1.651 m)    Weight:      SpO2: 96%  97% 96%     Discharge Labs  BMET  Recent Labs Lab 01/17/13 2040 01/18/13 0420 01/19/13 1010 01/21/13 0720  NA 146* 138 140 141  K 3.4* 4.1 3.7 3.6  CL 100 109 113* 110  CO2 <7* 16* 17* 20  GLUCOSE 237* 115* 109* 94  BUN 11 8 10 10   CREATININE 1.31* 1.14* 1.72* 1.00  CALCIUM 11.1* 7.9*  8.4 9.0  MG  --  2.5 2.1  --   PHOS  --  2.2* 2.8  --      CBC   Recent Labs Lab 01/17/13 2040 01/18/13 0420 01/19/13 1010  HGB 14.1 11.8* 11.5*  HCT 44.5 33.9* 33.6*  WBC 30.0* 25.5* 18.7*  PLT 386 282 256   Anti-Coagulation No results found for this basename: INR,  in the last 168 hours    Discharge Orders   Future Orders Complete By Expires   Discharge instructions  As directed    Comments:     Continue keppra 500 mg twice daily.  Take your medicine every day.  Neurology follow up in 2-4 weeks. According to [facility] law you must remain seizure free for at least 6 months before resuming driving.  Other seizure precautions - no swimming alone, no operating heavy machinery, no climbing, no risky tasks.   Driving Restrictions  As directed    Comments:     No driving until seizure free for 6 months - this is a safety issue for you, those riding with you, and other drivers.         Follow-up Information   Follow up with Selinda Flavin, MD On  02/11/2013. (10:00am -- [address])    Specialty:  Family Medicine   Contact information:   [address] [place] [address] [phone]       Follow up with Beryle Beams, MD On 02/05/2013. (12:15pm )    Specialty:  Neurology   Contact information:   [address] [place] [address] [phone]          Medication List         acetaminophen 500 MG tablet  Commonly known as:  TYLENOL  Take 500 mg by mouth every 6 (six) hours as needed for pain.     levETIRAcetam 500 MG tablet  Commonly known as:  KEPPRA  Take 0.5 tablets (250 mg total) by mouth 2 (two) times daily.     levofloxacin 500 MG tablet  Commonly known as:  LEVAQUIN  Take 1 tablet (500 mg total) by mouth daily.          Disposition: 01-Home or Self Care  Discharged Condition: Katherine Simmons has met maximum benefit of inpatient care and is medically stable and cleared for discharge.  Patient is pending follow up as above.      Time spent on disposition:  Greater than 35 minutes.   SignedDanford Bad, NP 01/21/2013  10:35 AM Pager: [phone] or [phone]   sTAFF NOTE *Care during the described time interval was provided by me and/or other providers on the critical care team. I have reviewed this patient's available data, including medical history, events of note, physical examination and test results as part of my evaluation.      Dr. Kalman Shan, M.D., [facility].C.P Pulmonary and Critical Care Medicine Staff Physician [facility] Pager: [phone], If no answer or between  15:00h - 7:00h: call [phone]  01/21/2013 10:47 AM

## 2013-01-21 NOTE — Progress Notes (Signed)
NEURO HOSPITALIST PROGRESS NOTE   SUBJECTIVE:                                                                                                                        No further seizures noted. Offers no neurological complains. Keppra 500 mg BID.  OBJECTIVE:                                                                                                                           Vital signs in last 24 hours: Temp:  [98.8 F (37.1 C)-99.4 F (37.4 C)] 98.8 F (37.1 C) (08/18 0545) Pulse Rate:  [94-114] 94 (08/18 0545) Resp:  [20-36] 20 (08/18 0545) BP: (106-129)/(65-76) 106/65 mmHg (08/18 0545) SpO2:  [95 %-98 %] 97 % (08/18 0545) Weight:  [136.85 kg (301 lb 11.2 oz)] 136.85 kg (301 lb 11.2 oz) (08/17 2234)  Intake/Output from previous day: 08/17 0701 - 08/18 0700 In: 1070 [P.O.:720; IV Piggyback:350] Out: 200 [Urine:200] Intake/Output this shift:   Nutritional status: Cardiac  Past Medical History  Diagnosis Date  . Scabies   . Seizure last 07/2009    Neurologic Exam:  Mental Status: Alert, oriented, thought content appropriate.  Speech fluent without evidence of aphasia.  Able to follow 3 step commands without difficulty. Cranial Nerves: II: Discs flat bilaterally; Visual fields grossly normal, pupils equal, round, reactive to light and accommodation III,IV, VI: ptosis not present, extra-ocular motions intact bilaterally V,VII: smile symmetric, facial light touch sensation normal bilaterally VIII: hearing normal bilaterally IX,X: gag reflex present XI: bilateral shoulder shrug XII: midline tongue extension Motor: Right : Upper extremity   5/5    Left:     Upper extremity   5/5  Lower extremity   5/5     Lower extremity   5/5 Tone and bulk:normal tone throughout; no atrophy noted Sensory: Pinprick and light touch intact throughout, bilaterally Deep Tendon Reflexes:  1+ all over  Plantars: Right: downgoing   Left:  downgoing Cerebellar: normal finger-to-nose,  normal heel-to-shin test Gait:  No tested. CV: pulses palpable throughout    Lab Results: No results found for this basename: cbc, bmp, coags, chol, tri, ldl, hga1c   Lipid Panel No results found for this basename: CHOL, TRIG,  HDL, CHOLHDL, VLDL, LDLCALC,  in the last 72 hours  Studies/Results: No results found.  MEDICATIONS                                                                                                                       I have reviewed the patient's current medications.  ASSESSMENT/PLAN:                                                                                                           Resolved GTC SE. No neurological issues at this moment. Continue keppra 500 mg BID. Needs neurology follow up in 2-4 weeks. She was informed that according to [place] law she must remain seizure free for at least 6 months before resuming driving. Other seizure precautions ( no swimming alone, no operating heavy machinery, no climbing, no risky tasks) were explained to patient.  Katherine Portela, MD [facility] [phone]  01/21/2013, 7:42 AM

## 2013-01-22 LAB — CULTURE, RESPIRATORY W GRAM STAIN

## 2013-01-23 LAB — CULTURE, BLOOD (ROUTINE X 2): Culture: NO GROWTH

## 2013-03-12 ENCOUNTER — Emergency Department (HOSPITAL_COMMUNITY)
Admission: EM | Admit: 2013-03-12 | Discharge: 2013-03-13 | Disposition: A | Discharge status: Home / Self Care | Payer: Medicaid Other | Attending: Emergency Medicine | Admitting: Emergency Medicine

## 2013-03-12 ENCOUNTER — Encounter (HOSPITAL_COMMUNITY): Payer: Self-pay | Admitting: *Deleted

## 2013-03-12 DIAGNOSIS — Z8619 Personal history of other infectious and parasitic diseases: Secondary | ICD-10-CM | POA: Insufficient documentation

## 2013-03-12 DIAGNOSIS — Z79899 Other long term (current) drug therapy: Secondary | ICD-10-CM | POA: Insufficient documentation

## 2013-03-12 DIAGNOSIS — O99330 Smoking (tobacco) complicating pregnancy, unspecified trimester: Secondary | ICD-10-CM | POA: Insufficient documentation

## 2013-03-12 DIAGNOSIS — G40909 Epilepsy, unspecified, not intractable, without status epilepticus: Secondary | ICD-10-CM | POA: Insufficient documentation

## 2013-03-12 DIAGNOSIS — O209 Hemorrhage in early pregnancy, unspecified: Secondary | ICD-10-CM | POA: Insufficient documentation

## 2013-03-12 DIAGNOSIS — R1031 Right lower quadrant pain: Secondary | ICD-10-CM | POA: Insufficient documentation

## 2013-03-12 HISTORY — DX: Unspecified ectopic pregnancy without intrauterine pregnancy: O00.90

## 2013-03-12 LAB — URINE MICROSCOPIC-ADD ON

## 2013-03-12 LAB — URINALYSIS, ROUTINE W REFLEX MICROSCOPIC
Bilirubin Urine: NEGATIVE
Ketones, ur: 15 mg/dL — AB
Nitrite: NEGATIVE
Protein, ur: 100 mg/dL — AB
pH: 5.5 (ref 5.0–8.0)

## 2013-03-12 NOTE — ED Notes (Signed)
Pt with abd pain since the other day and pain has increased today per pt, denies N/V/D

## 2013-03-12 NOTE — ED Provider Notes (Signed)
CSN: [id number]     Arrival date & time 03/12/13  2300 History  This chart was scribed for Gerhard Munch, MD by Bennett Scrape, ED Scribe. This patient was seen in room [facility]02/[facility] and the patient's care was started at 11:50 PM.   Chief Complaint  Patient presents with  . Abdominal Pain    The history is provided by the patient. No language interpreter was used.    HPI Comments: Katherine Simmons is a 27 y.o. female who presents to the Emergency Department complaining of persistent, gradual onset lower abdominal pain, worse in the RLQ that started yesterday. She reports that she also experienced intermittent vaginal bleeding that started 1.5 to 2 weeks ago. She states that she had one day of "light flow" that resolved and returned as a "light spotting:" the next day. She reports that this pattern continued for almost one week. LNMP prior to last was last month and she states that her normal menses is a "much heavier flow".  She reports having an ectopic pregnancy last year that required surgery performed by Dr. Emelda Fear.  She was evaluated by her PCP this morning after having to leave work due to the symptoms and had an appointment made with Dr. Emelda Fear later this week but was concerned due to her prior ectopic pregnancy history. She is G2P1A1. She denies having a h/o CVAs, MIs and CAs. She denies fevers, emesis, confusion, lightheadedness, dizziness and syncope as associated symptoms.  [facility]    Past Medical History  Diagnosis Date  . Scabies   . Seizure last 07/2009  . Ectopic pregnancy    Past Surgical History  Procedure Laterality Date  . No past surgeries    . Ectopic pregnancy surgery     Family History  Problem Relation Age of Onset  . Hypertension Mother   . Cancer Mother    History  Substance Use Topics  . Smoking status: Current Every Day Smoker -- 0.50 packs/day    Types: Cigarettes  . Smokeless tobacco: Not on file  . Alcohol Use: 0.5  oz/week    1 drink(s) per week     Comment: occasional   OB History   Grav Para Term Preterm Abortions TAB SAB Ect Mult Living   3 1   1           Obstetric Comments   O POSITIVE per old records     Review of Systems  A complete 10 system review of systems was obtained and all systems are negative except as noted in the HPI and PMH.   Allergies  Review of patient's allergies indicates no known allergies.  Home Medications   Current Outpatient Rx  Name  Route  Sig  Dispense  Refill  . acetaminophen (TYLENOL) 500 MG tablet   Oral   Take 500 mg by mouth every 6 (six) hours as needed for pain.         Marland Kitchen levETIRAcetam (KEPPRA) 500 MG tablet   Oral   Take 0.5 tablets (250 mg total) by mouth 2 (two) times daily.   60 tablet   0   . levofloxacin (LEVAQUIN) 500 MG tablet   Oral   Take 1 tablet (500 mg total) by mouth daily.   5 tablet   0    Triage Vitals: BP 112/79  Pulse 104  Temp(Src) 98.2 F (36.8 C) (Oral)  Resp 20  Ht 5\' 5"  (1.651 m)  Wt 289 lb (131.09 kg)  BMI 48.09  kg/m2  SpO2 100%  Physical Exam  Nursing note and vitals reviewed. Constitutional: She is oriented to person, place, and time. She appears well-developed and well-nourished. No distress.  HENT:  Head: Normocephalic and atraumatic.  Eyes: Conjunctivae and EOM are normal.  Cardiovascular: Normal rate and regular rhythm.   Pulmonary/Chest: Effort normal and breath sounds normal. No stridor. No respiratory distress.  Abdominal: She exhibits no distension. There is tenderness (LLQ and suprapubic). There is no rebound and no guarding.  Musculoskeletal: She exhibits no edema.  Neurological: She is alert and oriented to person, place, and time. No cranial nerve deficit.  Skin: Skin is warm and dry.  Psychiatric: She has a normal mood and affect.    ED Course  Procedures (including critical care time)  DIAGNOSTIC STUDIES: Oxygen Saturation is 100% on room air, normal by my interpretation.     COORDINATION OF CARE: 11:56 PM-Discussed treatment plan which includes [place], UA and hemoglobin with pt at bedside and pt agreed to plan.   Labs Review Labs Reviewed  URINALYSIS, ROUTINE W REFLEX MICROSCOPIC  PREGNANCY, URINE   Imaging Review No results found.  MDM  No diagnosis found.  I personally performed the services described in this documentation, which was scribed in my presence. The recorded information has been reviewed and is accurate.   This female with one of prior successful pregnancy, one prior ectopic pregnancy now presents with concern of vaginal bleeding, abdominal pain. On exam she is awake alert, hemodynamically stable.  With her history, her pregnancy, ectopic was clearly a concern.  Ultrasound did not demonstrate ectopic pregnancy, but is more consistent with either early pregnancy or miscarriage.  Patient remained hemodynamically stable throughout her emergency department course.  Absent evidence for ectopic pregnancy, and with patient's ability to follow up with her obstetrician, she was stable for discharge with repeat hCG in one a day, obstetrics followup.  Gerhard Munch, MD 03/13/13 [phone]

## 2013-03-13 ENCOUNTER — Encounter (HOSPITAL_COMMUNITY): Payer: Self-pay | Admitting: Radiology

## 2013-03-13 ENCOUNTER — Emergency Department (HOSPITAL_COMMUNITY): Payer: Medicaid Other

## 2013-03-13 ENCOUNTER — Encounter: Payer: Self-pay | Admitting: Advanced Practice Midwife

## 2013-03-13 ENCOUNTER — Ambulatory Visit (INDEPENDENT_AMBULATORY_CARE_PROVIDER_SITE_OTHER): Payer: Medicaid Other | Admitting: Advanced Practice Midwife

## 2013-03-13 ENCOUNTER — Encounter (INDEPENDENT_AMBULATORY_CARE_PROVIDER_SITE_OTHER): Payer: Self-pay

## 2013-03-13 VITALS — BP 132/70 | Ht 65.0 in | Wt 287.5 lb

## 2013-03-13 DIAGNOSIS — Z1389 Encounter for screening for other disorder: Secondary | ICD-10-CM

## 2013-03-13 DIAGNOSIS — Z331 Pregnant state, incidental: Secondary | ICD-10-CM

## 2013-03-13 DIAGNOSIS — O200 Threatened abortion: Secondary | ICD-10-CM | POA: Insufficient documentation

## 2013-03-13 DIAGNOSIS — G40909 Epilepsy, unspecified, not intractable, without status epilepticus: Secondary | ICD-10-CM | POA: Insufficient documentation

## 2013-03-13 LAB — POCT URINALYSIS DIPSTICK

## 2013-03-13 LAB — HEMOGLOBIN AND HEMATOCRIT, BLOOD
HCT: 36.1 % (ref 36.0–46.0)
Hemoglobin: 12.3 g/dL (ref 12.0–15.0)

## 2013-03-13 MED ORDER — ONDANSETRON 8 MG PO TBDP
8.0000 mg | ORAL_TABLET | Freq: Once | ORAL | Status: AC
  Administered 2013-03-13: 8 mg via ORAL
  Filled 2013-03-13: qty 1

## 2013-03-13 MED ORDER — HYDROMORPHONE HCL PF 1 MG/ML IJ SOLN
1.0000 mg | Freq: Once | INTRAMUSCULAR | Status: AC
  Administered 2013-03-13: 1 mg via INTRAMUSCULAR
  Filled 2013-03-13: qty 1

## 2013-03-13 MED ORDER — ONDANSETRON 4 MG PO TBDP: 4.0000 mg | ORAL_TABLET | Freq: Three times a day (TID) | ORAL | Status: DC | PRN

## 2013-03-13 NOTE — ED Notes (Signed)
Pt was vomiting up mucous white substance, EDP aware and orders received.

## 2013-03-13 NOTE — Progress Notes (Signed)
Estelle June McCurdy 27 y.o.  HPI: seen in ED last night and told to f/u here today.  Was bleeding with + UPT, LLQ pain.  QHCG=3407; u/s did not show IUP or ectopic.  L ovarian cyst.  Past Medical History  Diagnosis Date  . Scabies   . Seizure last 07/2009  . Ectopic pregnancy     Past Surgical History  Procedure Laterality Date  . Laparoscopic unilateral salpingectomy Right 2013    For ectopic pregnancy    Today, pain is less, but more widely spread, all across lower abdomen.  R tube surgically absent.  Bleeding has increased, but still not as heavy as a period.  Discussed POC with Dr. Despina Hidden.  Will check Qhcg and ABO tomorrow with possible u/s.  Possible dx include:  Very early IUP; ectopic pregnancy; inevitable ab.    Pt agreeable to POC.

## 2013-03-14 ENCOUNTER — Other Ambulatory Visit (HOSPITAL_COMMUNITY): Payer: Self-pay | Admitting: Nurse Practitioner

## 2013-03-14 ENCOUNTER — Encounter (INDEPENDENT_AMBULATORY_CARE_PROVIDER_SITE_OTHER): Payer: Self-pay

## 2013-03-14 ENCOUNTER — Ambulatory Visit (INDEPENDENT_AMBULATORY_CARE_PROVIDER_SITE_OTHER): Payer: Medicaid Other | Admitting: Obstetrics & Gynecology

## 2013-03-14 ENCOUNTER — Inpatient Hospital Stay (HOSPITAL_COMMUNITY)
Admission: AD | Admit: 2013-03-14 | Discharge: 2013-03-14 | Disposition: A | Discharge status: Home / Self Care | Payer: Medicaid Other | Source: Ambulatory Visit | Attending: Obstetrics & Gynecology | Admitting: Obstetrics & Gynecology

## 2013-03-14 ENCOUNTER — Encounter: Payer: Self-pay | Admitting: Obstetrics & Gynecology

## 2013-03-14 ENCOUNTER — Other Ambulatory Visit: Payer: Self-pay | Admitting: Obstetrics & Gynecology

## 2013-03-14 ENCOUNTER — Ambulatory Visit (INDEPENDENT_AMBULATORY_CARE_PROVIDER_SITE_OTHER): Payer: Medicaid Other

## 2013-03-14 ENCOUNTER — Other Ambulatory Visit: Payer: Medicaid Other

## 2013-03-14 ENCOUNTER — Encounter (HOSPITAL_COMMUNITY): Payer: Self-pay | Admitting: *Deleted

## 2013-03-14 VITALS — BP 110/70 | Ht 65.0 in | Wt 289.0 lb

## 2013-03-14 DIAGNOSIS — Z8742 Personal history of other diseases of the female genital tract: Secondary | ICD-10-CM

## 2013-03-14 DIAGNOSIS — Z8759 Personal history of other complications of pregnancy, childbirth and the puerperium: Secondary | ICD-10-CM

## 2013-03-14 DIAGNOSIS — O009 Unspecified ectopic pregnancy without intrauterine pregnancy: Secondary | ICD-10-CM

## 2013-03-14 DIAGNOSIS — O200 Threatened abortion: Secondary | ICD-10-CM

## 2013-03-14 DIAGNOSIS — O00109 Unspecified tubal pregnancy without intrauterine pregnancy: Secondary | ICD-10-CM | POA: Insufficient documentation

## 2013-03-14 DIAGNOSIS — R102 Pelvic and perineal pain: Secondary | ICD-10-CM

## 2013-03-14 HISTORY — DX: Unspecified infectious disease: B99.9

## 2013-03-14 HISTORY — DX: Headache: R51

## 2013-03-14 LAB — CREATININE, SERUM
Creatinine, Ser: 0.82 mg/dL (ref 0.50–1.10)
GFR calc non Af Amer: >90 mL/min (ref >90)

## 2013-03-14 LAB — ABO/RH: ABO/RH(D): O POS

## 2013-03-14 LAB — TYPE AND SCREEN
ABO/RH(D): O POS
Antibody Screen: NEGATIVE

## 2013-03-14 LAB — HCG, QUANTITATIVE, PREGNANCY: hCG, Beta Chain, Quant, S: 3628 m[IU]/mL

## 2013-03-14 LAB — BUN: BUN: 9 mg/dL (ref 6–23)

## 2013-03-14 LAB — AST: AST: 17 U/L (ref 0–37)

## 2013-03-14 MED ORDER — METHOTREXATE INJECTION FOR WOMEN'S HOSPITAL: 50.0000 mg/m2 | Freq: Once | INTRAMUSCULAR | Status: DC

## 2013-03-14 MED ORDER — METHOTREXATE INJECTION FOR WOMEN'S HOSPITAL
50.0000 mg/m2 | Freq: Once | INTRAMUSCULAR | Status: AC
  Administered 2013-03-14: 120 mg via INTRAMUSCULAR
  Filled 2013-03-14: qty 2.4

## 2013-03-14 NOTE — Progress Notes (Signed)
Patient ID: Katherine Simmons, female   DOB: 09/27/1985, 27 y.o.   MRN: [id number] Patient comes in today for a repeat evaluation of the status of her pregnancy G4P1031(1 termination and now 2 ectopic pregnancies)  [facility] had a quantitative hCG of 3400 and a sonogram which showed an empty uterus but no adnexal masses  She comes back her office today with an hCG of 3600 still an empty uterus and a throat 33 x 24 mm left adnexal mass with blood flow Within within and not ovarian in origin  His result she now has a left ectopic pregnancy she has a history of a right ectopic pregnancy in the past which required laparoscopic surgery September 2013  As a result she is having good underwent incision and a U. to have methotrexate therapy, I have talked to Eve who is working the maternity admission unit right now and she is away her the clinical situation and the plan  I will see the patient back on Monday for day for hCG level and then we will follow her levels down weekly thereafter The patient understands a repeat methotrexate dose as possible and even the possibility of failure altogether of conservative medical management

## 2013-03-14 NOTE — MAU Provider Note (Signed)
History     CSN: [id number]  Arrival date and time: 03/14/13 1613   None     No chief complaint on file.  HPI Katherine Simmons is 27 y.o. Unknown weeks presenting from Dr. Forestine Chute office for MTX treatment for left ectopic pregnancy.  BHCG on 10/7 was 3407 and U/S did not see IUP or ectopic pregnancy.  Today, BHCG 3628 and U/S findings of left ectopic.  Has hx of ectopic pregnancy.         Patient ID: Katherine Simmons, female DOB: [birth year], 27 y.o. MRN: [id number]  Patient comes in today for a repeat evaluation of the status of her pregnancy  G4P1031(1 termination and now 2 ectopic pregnancies)  [facility] had a quantitative hCG of 3400 and a sonogram which showed an empty uterus but no adnexal masses  She comes back her office today with an hCG of 3600 still an empty uterus and a throat 33 x 24 mm left adnexal mass with blood flow Within within and not ovarian in origin  His result she now has a left ectopic pregnancy she has a history of a right ectopic pregnancy in the past which required laparoscopic surgery September 2013  As a result she is having good underwent incision and a U. to have methotrexate therapy, I have talked to Eve who is working the maternity admission unit right now and she is away her the clinical situation and the plan  I will see the patient back on Monday for day for hCG level and then we will follow her levels down weekly thereafter  The patient understands a repeat methotrexate dose as possible and even the possibility of failure altogether of conservative medical management  Dr. Duane Lope  Pt states she is having some cramping but not like she was  Past Medical History  Diagnosis Date  . Scabies   . Ectopic pregnancy   . Seizure last 01/2013    has epilepsy  . Epilepsy 01/2013 last seizure    had quit meds. THey were restarted    Past Surgical History  Procedure Laterality Date  . Laparoscopic unilateral salpingectomy  Right 2013    For ectopic pregnancy    Family History  Problem Relation Age of Onset  . Hypertension Mother   . Cancer Mother     History  Substance Use Topics  . Smoking status: Current Every Day Smoker -- 0.50 packs/day    Types: Cigarettes  . Smokeless tobacco: Never Used  . Alcohol Use: 0.5 oz/week    1 drink(s) per week     Comment: occasional; not now    Allergies: No Known Allergies  Prescriptions prior to admission  Medication Sig Dispense Refill  . acetaminophen (TYLENOL) 500 MG tablet Take 500 mg by mouth every 6 (six) hours as needed for pain.      Marland Kitchen levETIRAcetam (KEPPRA) 500 MG tablet Take 0.5 tablets (250 mg total) by mouth 2 (two) times daily.  60 tablet  0  . ondansetron (ZOFRAN ODT) 4 MG disintegrating tablet Take 1 tablet (4 mg total) by mouth every 8 (eight) hours as needed for nausea.  10 tablet  0    ROS Physical Exam   Last menstrual period 02/22/2013.  Physical Exam  Nursing note and vitals reviewed. Constitutional: She is oriented to person, place, and time. She appears well-developed and well-nourished. No distress.  Neck: Normal range of motion.  Cardiovascular: Normal rate.   Respiratory:  Effort normal.  Musculoskeletal: Normal range of motion.  Neurological: She is alert and oriented to person, place, and time.  Skin: Skin is warm and dry.  Psychiatric: She has a normal mood and affect.    MAU Course  Procedures Results for orders placed during the hospital encounter of 03/14/13 (from the past 24 hour(s))  AST     Status: None   Collection Time    03/14/13  4:45 PM      Result Value Range   AST 17  0 - 37 U/L  BUN     Status: None   Collection Time    03/14/13  4:45 PM      Result Value Range   BUN 9  6 - 23 mg/dL  CREATININE, SERUM     Status: None   Collection Time    03/14/13  4:45 PM      Result Value Range   Creatinine, Ser 0.82  0.50 - 1.10 mg/dL   GFR calc non Af Amer >90  >90 mL/min   GFR calc Af Amer >90  >90 mL/min   TYPE AND SCREEN     Status: None   Collection Time    03/14/13  4:45 PM      Result Value Range   ABO/RH(D) O POS     Antibody Screen NEG     Sample Expiration 03/17/2013    US Ob Comp Less 14 Wks  03/13/2013   *RADIOLOGY REPORT*  Clinical Data: Pain, bleeding, history of ectopic pregnancy  OBSTETRIC <14 WK [place] AND TRANSVAGINAL OB US  Technique:  Both transabdominal and transvaginal ultrasound examinations were performed for complete evaluation of the gestation as well as the maternal uterus, adnexal regions, and pelvic cul-de-sac.  Transvaginal technique was performed to assess early pregnancy.  Comparison:  Prior pelvic ultrasound 08/05/2006  Intrauterine gestational sac:  No intrauterine gestational sac identified. Yolk sac: None Embryo: None Cardiac Activity: None Heart Rate: None  Maternal uterus/adnexae: Unremarkable sonographic appearance of the uterus.  The right ovary is surgically absent.  The left ovary measures 3.9 x 3.1 x 2.3 cm. A sonographically simple 1.4 x 1.5 x 1.2 cm cystic structure is present within the ovary.  No definite adnexal mass identified. Mild - moderate pelvic free fluid likely within physiologic limits.  IMPRESSION:  1.  No intra uterine gestational sac identified. 2.  Sonographically simple 1.5 cm cyst in the left ovary. 3.  Mild - moderate free pelvic fluid.  Differential considerations include very early intrauterine pregnancy, early failed intrauterine pregnancy, and ectopic pregnancy.  Recommend clinical correlation with serum serial beta HCG.  Ultrasound can be repeated in 3 - 4 weeks if clinically warranted.   Original Report Authenticated By: Malachy Moan, M.D.   US Ob Transvaginal  03/14/2013   DATING AND VIABILITY SONOGRAM   Katherine Simmons is a 27 y.o. year old G4P1A1E1 with LMP 02/21/2013.  She  is here today for a sonogram due to vaginal bleeding since Monday 03/11/13.  Pt with h/o Ectopic with RSO in September 2013.    GESTATION: CERVIX: Appears long and  closed  ADNEXA: The Rt adnexa appears WNL, (s/p RSO) .LT adnexa appears with 33 x 24mm  complex mass noted separate from the ovary, small amount of free fluid  noted within posterior cul-de-sac   GESTATIONAL AGE AND  BIOMETRICS:  Gestational criteria: Estimated Date of Delivery: Unknown  Previous Scans:0  GESTATIONAL SAC  mm          weeks  CROWN RUMP LENGTH            mm          weeks                                                                               AVERAGE EGA(BY THIS SCAN):    weeks     TECHNICIAN COMMENTS:  NO gestational sac, yolk sac or fetal pole noted within uterus,  ENDOM=4.50mm, small amount of free fluid noted in posterior cul-de-sac, 33  x 24 mm complex mass with +Doppler flow noted within noted in LT adnexa  separate from the left ovary (seen best transabdominally)       A copy of this report including all images has been saved and backed up to  a second source for retrieval if needed. All measures and details of the  anatomical scan, placentation, fluid volume and pelvic anatomy are  contained in that report.  Chari Manning 03/14/2013 1:30 PM  Left ectopic pregnancy 33 x 24 mm, with essentially flat HCG levels and an  empty uterus  Patient going don to [facility] to receive methotrexate, follow up here on  Monday for HCG level  EURE,LUTHER H 03/14/2013 2:21 PM    discussed with pt MTX and importance of following up with repeat HCG labs F/u sooner if increase in pain/bleeding NSAID for pain   Care turned over M. Williams,CNM Assumed care from Wynelle Bourgeois, CNM Assessment and Plan  A:  Left ectopic pregnancy MTX given F/u on Monday Oct 13 with Dr. Despina Hidden for repeat HCG Ectopic precautions     KEY,EVE M 03/14/2013, 4:37 PM

## 2013-03-14 NOTE — MAU Note (Signed)
Was sent from dr's office for methotrexate.  Went through this last year, had surgery- removed preg and right tube.  Been getting blood work in office,had [place]. Having pain lower abd to left side, is bleeding- heavy.

## 2013-03-18 ENCOUNTER — Ambulatory Visit (HOSPITAL_COMMUNITY): Payer: Medicaid Other

## 2013-03-18 ENCOUNTER — Other Ambulatory Visit: Payer: Medicaid Other

## 2013-03-18 ENCOUNTER — Encounter (INDEPENDENT_AMBULATORY_CARE_PROVIDER_SITE_OTHER): Payer: Self-pay

## 2013-03-18 ENCOUNTER — Other Ambulatory Visit: Payer: Self-pay | Admitting: Obstetrics and Gynecology

## 2013-03-18 ENCOUNTER — Ambulatory Visit (INDEPENDENT_AMBULATORY_CARE_PROVIDER_SITE_OTHER): Payer: Medicaid Other | Admitting: Obstetrics and Gynecology

## 2013-03-18 ENCOUNTER — Encounter: Payer: Self-pay | Admitting: Obstetrics and Gynecology

## 2013-03-18 ENCOUNTER — Ambulatory Visit (INDEPENDENT_AMBULATORY_CARE_PROVIDER_SITE_OTHER): Payer: Medicaid Other

## 2013-03-18 ENCOUNTER — Encounter (HOSPITAL_COMMUNITY): Payer: Self-pay | Admitting: Pharmacy Technician

## 2013-03-18 VITALS — BP 108/68 | Temp 98.2°F | Ht 65.0 in | Wt 285.4 lb

## 2013-03-18 DIAGNOSIS — O00109 Unspecified tubal pregnancy without intrauterine pregnancy: Secondary | ICD-10-CM

## 2013-03-18 DIAGNOSIS — O200 Threatened abortion: Secondary | ICD-10-CM

## 2013-03-18 DIAGNOSIS — O009 Unspecified ectopic pregnancy without intrauterine pregnancy: Secondary | ICD-10-CM

## 2013-03-18 NOTE — Progress Notes (Addendum)
Patient ID: Katherine Simmons, female   DOB: [birth year], 27 y.o.   MRN: [id number]   [facility] Visit  Patient name: Katherine Simmons MRN [id number]  Date of birth: Nov 18, 1985  CC & HPI:  Katherine Simmons is a 27 y.o. female  G14 p7 presenting today for 3 day f/u for left ECTOPIC  PREGNANCY TX'D WITH MTX IN [facility] MAU 03/14/13. HAVING increase in pain over weekend and this am.  Ultrasound repeated, showing a dramatic increase in size and discomfort of enlarged ectopic complex on left.  Patient is aware that this makes salvage of tube less likely. Tubal complex shows generous blood supply to mass. No free fluid appreciated. Patient too uncomfortable for continued medical management. Stat Quant hcg ordered, results  3325, down from 3628, but pain is too much for pt to tolerate. Pt will be given note for being out this week.  ROS:  Prior surgery for rt ectopic (salpingectomy) ,pt wants to save this tube if surgery required. Patient informed of difficult of salvage given the large distorted size of adnexal mass. Had lightheaded episode with BM last Saturday.(pain) Pertinent History Reviewed:  Medical & Surgical Hx:  Reviewed: Significant for see pmh Medications: Reviewed & Updated - see associated section Social History: Reviewed -  reports that she has been smoking Cigarettes.  She has a 6 pack-year smoking history. She has never used smokeless tobacco.  Objective Findings:  Vitals: BP 108/68  Temp(Src) 98.2 F (36.8 C) (Oral)  Ht 5\' 5"  (1.651 m)  Wt 285 lb 6.4 oz (129.457 kg)  BMI 47.49 kg/m2  LMP 02/22/2013  Physical Examination: will do TV u/s Had lightheaded episode sat. U/S 8 CM ADNEXAL COMPLEX, INCREASED IN SIZE  Assessment & Plan:   SYMPTOMATIC  LEFT ECTOPIC  TO OR; FOR SURGERY Tuesday 1300 Will proceed with laparoscopic removal of pregnancy , probable left salpingectomy    2

## 2013-03-18 NOTE — Progress Notes (Signed)
U/S-Lt adnexal mass increased in size to 8.0 x 5.6cm (seen best abdominally), +Doppler flow noted, no free fluid noted within pelvis,

## 2013-03-18 NOTE — Patient Instructions (Signed)
NO EATING OR DRINKING AFTER MIDNIGHT TONIGHT. BE AT [facility] AT 11:30 AM FOR SURGERY TOMORROW TAKE YOUR MEDICINES WITH A SIP OF WATER ONLY IF NEEDED IN THE MORNING.Ectopic Pregnancy An ectopic pregnancy happens when a fertilized egg grows outside the uterus. A pregnancy cannot live outside of the uterus. This problem often happens in the fallopian tube. It is often caused by damage to the fallopian tube. If this problem is found early, you may be treated with medicine. If your tube tears or bursts open (ruptures), you will bleed inside. This is an emergency. You will need surgery. Get help right away.  SYMPTOMS You may have normal pregnancy symptoms at first. These include:  Missing your period.  Feeling sick to your stomach (nauseous).  Being tired.  Having tender breasts. Then, you may start to have symptoms that are not normal. These include:  Pain with sex (intercourse).  Bleeding from the vagina. This includes light bleeding (spotting).  Belly (abdomen) or lower belly cramping or pain. This may be felt on one side.  A fast heartbeat (pulse).  Passing out (fainting) after going poop (bowel movement). If your tube tears, you may have symptoms such as:  Really bad pain in the belly or lower belly. This happens suddenly.  Dizziness.  Passing out.  Shoulder pain. GET HELP RIGHT AWAY IF:  You have any symptoms that are not normal. This is an emergency. Document Released: 08/19/2008 Document Revised: 08/15/2011 Document Reviewed: 01/27/2011 [facility] Patient Information 2014 [place], [place].

## 2013-03-19 ENCOUNTER — Ambulatory Visit (HOSPITAL_COMMUNITY)
Admission: RE | Admit: 2013-03-19 | Discharge: 2013-03-19 | Disposition: A | Discharge status: Home / Self Care | Payer: Medicaid Other | Source: Ambulatory Visit | Attending: Obstetrics and Gynecology | Admitting: Obstetrics and Gynecology

## 2013-03-19 ENCOUNTER — Encounter (HOSPITAL_COMMUNITY): Payer: Medicaid Other | Admitting: Anesthesiology

## 2013-03-19 ENCOUNTER — Encounter (HOSPITAL_COMMUNITY)
Admission: RE | Disposition: A | Discharge status: Home / Self Care | Payer: Self-pay | Source: Ambulatory Visit | Attending: Obstetrics and Gynecology

## 2013-03-19 ENCOUNTER — Encounter (HOSPITAL_COMMUNITY): Payer: Self-pay | Admitting: *Deleted

## 2013-03-19 ENCOUNTER — Ambulatory Visit (HOSPITAL_COMMUNITY): Payer: Medicaid Other | Admitting: Anesthesiology

## 2013-03-19 ENCOUNTER — Encounter: Payer: Self-pay | Admitting: Obstetrics and Gynecology

## 2013-03-19 DIAGNOSIS — K661 Hemoperitoneum: Secondary | ICD-10-CM | POA: Diagnosis present

## 2013-03-19 DIAGNOSIS — O00109 Unspecified tubal pregnancy without intrauterine pregnancy: Secondary | ICD-10-CM | POA: Insufficient documentation

## 2013-03-19 DIAGNOSIS — O009 Unspecified ectopic pregnancy without intrauterine pregnancy: Secondary | ICD-10-CM

## 2013-03-19 DIAGNOSIS — Z01812 Encounter for preprocedural laboratory examination: Secondary | ICD-10-CM | POA: Insufficient documentation

## 2013-03-19 HISTORY — PX: LAPAROSCOPIC UNILATERAL SALPINGECTOMY: SHX5934

## 2013-03-19 LAB — URINALYSIS, ROUTINE W REFLEX MICROSCOPIC
Glucose, UA: NEGATIVE mg/dL
Ketones, ur: NEGATIVE mg/dL
Nitrite: NEGATIVE
Specific Gravity, Urine: >1.03 — ABNORMAL HIGH (ref 1.005–1.030)

## 2013-03-19 LAB — URINE MICROSCOPIC-ADD ON

## 2013-03-19 LAB — CBC
Hemoglobin: 11.8 g/dL — ABNORMAL LOW (ref 12.0–15.0)
MCH: 31 pg (ref 26.0–34.0)
MCHC: 34.2 g/dL (ref 30.0–36.0)
Platelets: 383 10*3/uL (ref 150–400)
WBC: 12 10*3/uL — ABNORMAL HIGH (ref 4.0–10.5)

## 2013-03-19 SURGERY — SALPINGECTOMY, UNILATERAL, LAPAROSCOPIC
Anesthesia: General | Site: Abdomen | Laterality: Left | Wound class: Clean

## 2013-03-19 MED ORDER — NEOSTIGMINE METHYLSULFATE 1 MG/ML IJ SOLN
INTRAMUSCULAR | Status: AC
  Filled 2013-03-19: qty 1

## 2013-03-19 MED ORDER — BUPIVACAINE HCL (PF) 0.25 % IJ SOLN
INTRAMUSCULAR | Status: AC
  Filled 2013-03-19: qty 30

## 2013-03-19 MED ORDER — FENTANYL CITRATE 0.05 MG/ML IJ SOLN
INTRAMUSCULAR | Status: AC
  Filled 2013-03-19: qty 2

## 2013-03-19 MED ORDER — LIDOCAINE HCL (PF) 1 % IJ SOLN
INTRAMUSCULAR | Status: AC
  Filled 2013-03-19: qty 5

## 2013-03-19 MED ORDER — SODIUM CHLORIDE 0.9 % IR SOLN
Status: DC | PRN
  Administered 2013-03-19: 3000 mL

## 2013-03-19 MED ORDER — OXYTOCIN 10 UNIT/ML IJ SOLN
INTRAMUSCULAR | Status: AC
  Filled 2013-03-19: qty 1

## 2013-03-19 MED ORDER — ROCURONIUM BROMIDE 100 MG/10ML IV SOLN
INTRAVENOUS | Status: DC | PRN
  Administered 2013-03-19: 10 mg via INTRAVENOUS
  Administered 2013-03-19: 50 mg via INTRAVENOUS

## 2013-03-19 MED ORDER — FENTANYL CITRATE 0.05 MG/ML IJ SOLN
INTRAMUSCULAR | Status: AC
  Filled 2013-03-19: qty 5

## 2013-03-19 MED ORDER — NEOSTIGMINE METHYLSULFATE 1 MG/ML IJ SOLN
INTRAMUSCULAR | Status: DC | PRN
  Administered 2013-03-19: .4 mg via INTRAVENOUS

## 2013-03-19 MED ORDER — ONDANSETRON HCL 4 MG/2ML IJ SOLN
INTRAMUSCULAR | Status: AC
  Filled 2013-03-19: qty 2

## 2013-03-19 MED ORDER — BUPIVACAINE-EPINEPHRINE PF 0.5-1:200000 % IJ SOLN
INTRAMUSCULAR | Status: AC
  Filled 2013-03-19: qty 10

## 2013-03-19 MED ORDER — FENTANYL CITRATE 0.05 MG/ML IJ SOLN
25.0000 ug | INTRAMUSCULAR | Status: AC
  Administered 2013-03-19 (×2): 25 ug via INTRAVENOUS

## 2013-03-19 MED ORDER — IBUPROFEN 600 MG PO TABS: 600.0000 mg | ORAL_TABLET | Freq: Four times a day (QID) | ORAL | Status: DC | PRN

## 2013-03-19 MED ORDER — ONDANSETRON HCL 4 MG/2ML IJ SOLN
4.0000 mg | Freq: Once | INTRAMUSCULAR | Status: AC
  Administered 2013-03-19: 4 mg via INTRAVENOUS

## 2013-03-19 MED ORDER — ROCURONIUM BROMIDE 50 MG/5ML IV SOLN
INTRAVENOUS | Status: AC
  Filled 2013-03-19: qty 1

## 2013-03-19 MED ORDER — MIDAZOLAM HCL 2 MG/2ML IJ SOLN
INTRAMUSCULAR | Status: AC
  Filled 2013-03-19: qty 2

## 2013-03-19 MED ORDER — SODIUM CHLORIDE BACTERIOSTATIC 0.9 % IJ SOLN
INTRAMUSCULAR | Status: AC
  Filled 2013-03-19: qty 20

## 2013-03-19 MED ORDER — LACTATED RINGERS IV SOLN
INTRAVENOUS | Status: DC
  Administered 2013-03-19 (×2): via INTRAVENOUS

## 2013-03-19 MED ORDER — FENTANYL CITRATE 0.05 MG/ML IJ SOLN
INTRAMUSCULAR | Status: DC | PRN
  Administered 2013-03-19 (×6): 50 ug via INTRAVENOUS
  Administered 2013-03-19: 150 ug via INTRAVENOUS
  Administered 2013-03-19: 50 ug via INTRAVENOUS

## 2013-03-19 MED ORDER — PROPOFOL 10 MG/ML IV BOLUS
INTRAVENOUS | Status: DC | PRN
  Administered 2013-03-19: 170 mg via INTRAVENOUS

## 2013-03-19 MED ORDER — BUPIVACAINE-EPINEPHRINE PF 0.5-1:200000 % IJ SOLN
INTRAMUSCULAR | Status: DC | PRN
  Administered 2013-03-19: 10 mL

## 2013-03-19 MED ORDER — FENTANYL CITRATE 0.05 MG/ML IJ SOLN
25.0000 ug | INTRAMUSCULAR | Status: DC | PRN
  Administered 2013-03-19 (×2): 50 ug via INTRAVENOUS
  Filled 2013-03-19: qty 2

## 2013-03-19 MED ORDER — MIDAZOLAM HCL 2 MG/2ML IJ SOLN
1.0000 mg | INTRAMUSCULAR | Status: DC | PRN
  Administered 2013-03-19 (×2): 2 mg via INTRAVENOUS
  Filled 2013-03-19: qty 2

## 2013-03-19 MED ORDER — ONDANSETRON HCL 4 MG/2ML IJ SOLN: 4.0000 mg | Freq: Once | INTRAMUSCULAR | Status: DC | PRN

## 2013-03-19 MED ORDER — PROPOFOL 10 MG/ML IV EMUL
INTRAVENOUS | Status: AC
  Filled 2013-03-19: qty 20

## 2013-03-19 MED ORDER — SODIUM CHLORIDE 0.9 % IR SOLN
Status: DC | PRN
  Administered 2013-03-19: 500 mL

## 2013-03-19 MED ORDER — OXYCODONE-ACETAMINOPHEN 5-325 MG PO TABS: 1.0000 | ORAL_TABLET | ORAL | Status: DC | PRN

## 2013-03-19 MED ORDER — GLYCOPYRROLATE 0.2 MG/ML IJ SOLN
INTRAMUSCULAR | Status: AC
  Filled 2013-03-19: qty 3

## 2013-03-19 MED ORDER — LIDOCAINE HCL (PF) 1 % IJ SOLN
INTRAMUSCULAR | Status: AC
  Filled 2013-03-19: qty 2

## 2013-03-19 SURGICAL SUPPLY — 58 items
ADH SKN CLS APL DERMABOND .7 (GAUZE/BANDAGES/DRESSINGS) ×1
BAG HAMPER (MISCELLANEOUS) ×2 IMPLANT
BAG SPEC RTRVL LRG 6X4 10 (ENDOMECHANICALS) ×1
BLADE SURG SZ11 CARB STEEL (BLADE) ×2 IMPLANT
CATH ROBINSON RED A/P 16FR (CATHETERS) ×1 IMPLANT
CLOTH BEACON ORANGE TIMEOUT ST (SAFETY) ×2 IMPLANT
COVER LIGHT HANDLE STERIS (MISCELLANEOUS) ×4 IMPLANT
DERMABOND ADVANCED (GAUZE/BANDAGES/DRESSINGS) ×1
DERMABOND ADVANCED .7 DNX12 (GAUZE/BANDAGES/DRESSINGS) ×1 IMPLANT
DRESSING COVERLET 3X1 FLEXIBLE (GAUZE/BANDAGES/DRESSINGS) ×6 IMPLANT
ELECT REM PT RETURN 9FT ADLT (ELECTROSURGICAL) ×2
ELECTRODE REM PT RTRN 9FT ADLT (ELECTROSURGICAL) ×1 IMPLANT
FILTER SMOKE EVAC LAPAROSHD (FILTER) ×2 IMPLANT
FORMALIN 10 PREFIL 480ML (MISCELLANEOUS) ×4 IMPLANT
GAUZE SPONGE 4X4 16PLY XRAY LF (GAUZE/BANDAGES/DRESSINGS) ×2 IMPLANT
GLOVE ECLIPSE 9.0 STRL (GLOVE) ×2 IMPLANT
GLOVE INDICATOR STER SZ 9 (GLOVE) ×2 IMPLANT
GOWN STRL REIN 3XL LVL4 (GOWN DISPOSABLE) ×2 IMPLANT
GOWN STRL REIN XL XLG (GOWN DISPOSABLE) ×2 IMPLANT
INST SET LAPROSCOPIC GYN AP (KITS) ×2 IMPLANT
IV NS IRRIG 3000ML ARTHROMATIC (IV SOLUTION) ×2 IMPLANT
KIT ROOM TURNOVER APOR (KITS) ×2 IMPLANT
LIGASURE 5MM LAPAROSCOPIC (INSTRUMENTS) ×1 IMPLANT
LIGASURE LAP ATLAS 10MM 37CM (INSTRUMENTS) ×1 IMPLANT
MANIFOLD NEPTUNE II (INSTRUMENTS) ×2 IMPLANT
NDL HYPO 25X1 1.5 SAFETY (NEEDLE) ×1 IMPLANT
NDL INSUFFLATION 14GA 120MM (NEEDLE) ×1 IMPLANT
NEEDLE HYPO 25X1 1.5 SAFETY (NEEDLE) ×2 IMPLANT
NEEDLE INSUFFLATION 14GA 120MM (NEEDLE) ×2 IMPLANT
NS IRRIG 1000ML POUR BTL (IV SOLUTION) ×2 IMPLANT
PACK PERI GYN (CUSTOM PROCEDURE TRAY) ×2 IMPLANT
PAD ARMBOARD 7.5X6 YLW CONV (MISCELLANEOUS) ×2 IMPLANT
POUCH SPECIMEN RETRIEVAL 10MM (ENDOMECHANICALS) ×1 IMPLANT
SCALPEL HARMONIC ACE (MISCELLANEOUS) IMPLANT
SCISSORS LAP 5X35 DISP (ENDOMECHANICALS) IMPLANT
SET BASIN LINEN APH (SET/KITS/TRAYS/PACK) ×2 IMPLANT
SET TUBE IRRIG SUCTION NO TIP (IRRIGATION / IRRIGATOR) ×2 IMPLANT
SLEEVE ENDOPATH XCEL 5M (ENDOMECHANICALS) ×1 IMPLANT
SOL PREP PROV IODINE SCRUB 4OZ (MISCELLANEOUS) ×2 IMPLANT
SOLUTION ANTI FOG 6CC (MISCELLANEOUS) ×2 IMPLANT
STRIP CLOSURE SKIN 1/4X3 (GAUZE/BANDAGES/DRESSINGS) ×2 IMPLANT
SUT VIC AB 4-0 PS2 27 (SUTURE) ×2 IMPLANT
SUT VICRYL 0 UR6 27IN ABS (SUTURE) ×2 IMPLANT
SYR 20CC LL (SYRINGE) ×1 IMPLANT
SYR 30ML LL (SYRINGE) ×1 IMPLANT
SYR BULB IRRIGATION 50ML (SYRINGE) ×2 IMPLANT
SYR CONTROL 10ML LL (SYRINGE) ×2 IMPLANT
SYRINGE 10CC LL (SYRINGE) ×2 IMPLANT
TOWEL OR 17X26 4PK STRL BLUE (TOWEL DISPOSABLE) ×1 IMPLANT
TRAY FOLEY CATH 16FR SILVER (SET/KITS/TRAYS/PACK) ×1 IMPLANT
TROCAR ENDO BLADELESS 11MM (ENDOMECHANICALS) ×3 IMPLANT
TROCAR XCEL NON-BLD 5MMX100MML (ENDOMECHANICALS) ×2 IMPLANT
TROCAR XCEL UNIV SLVE 11M 100M (ENDOMECHANICALS) ×2 IMPLANT
TROCAR Z-THREAD SLEEVE 11X100 (TROCAR) ×2 IMPLANT
TUBING DYE INJECTION 900-617 (MISCELLANEOUS) IMPLANT
TUBING INSUFFLATION 10FT LAP (TUBING) ×2 IMPLANT
TUBING INSUFFLATION HIGH FLOW (TUBING) ×2 IMPLANT
WARMER LAPAROSCOPE (MISCELLANEOUS) ×2 IMPLANT

## 2013-03-19 NOTE — Addendum Note (Signed)
Addendum created 03/19/13 1634 by Moshe Salisbury, CRNA   Modules edited: Anesthesia Medication Administration

## 2013-03-19 NOTE — Brief Op Note (Signed)
03/19/2013  4:35 PM  PATIENT:  Katherine Simmons  27 y.o. female  PRE-OPERATIVE DIAGNOSIS:  left ectopic pregnancy  POST-OPERATIVE DIAGNOSIS:  left ectopic pregnancy  PROCEDURE:  Procedure(s): LAPAROSCOPIC LEFT SALPINGECTOMY (Left)  SURGEON:  Surgeon(s) and Role:    * Tilda Burrow, MD - Primary  PHYSICIAN ASSISTANT:   ASSISTANTS: Angela tWitt CST   ANESTHESIA:   general  EBL:  Total I/O In: 1300 [I.V.:1300] Out: 150 [Blood:150]  BLOOD ADMINISTERED:none  DRAINS: none   LOCAL MEDICATIONS USED:  MARCAINE    and Amount: 10 ml  SPECIMEN:  Source of Specimen:  Left fallopian tube  DISPOSITION OF SPECIMEN:  PATHOLOGY  COUNTS:  YES  TOURNIQUET:  * No tourniquets in log *  DICTATION: .Dragon Dictation Patient was taken to the operating room prepped and draped for combined abdominal and vaginal procedure. An infraumbilical vertical 1 cm incision was made as well as a transverse suprapubic incision. This one was 1 cm, later expanded to 3 cm. Right lower quadrant incision was also made. Veress needle was used to achieve pneumoperitoneum using water droplet technique to identify the peritoneal cavity. Laparoscopic pneumoperitoneum was achieved and then the laparoscope introduced under direct visualization. There was evidence of old blood in the pelvis and on the internal surfaces of the abdomen. Pelvis was irrigated. The sigmoid colon was adherent to the anterior abdominal wall and overlying the left adnexa. This could be gently teased free from these developing adhesions. Using laparoscopic instruments. The left tube was turned back into the cul-de-sac behind the uterus and cut the mobilized sufficiently to access. The LigaSure 5 mm grasper was then used and 10 to coagulate and transect the tube proximally and across the mesosalpinx. A small 5 mm LigaSure grasper would not grasp the large distorted and distended tube so a 10 mm LigaSure grasper was obtained, and the tube amputated  off with some difficulty due to the significant amount swelling and fibrosis. There was some steady ooze from the area just where the tube had been amputated off the area nearest to the ovary and this required unipolar cautery to achieve adequate hemostasis. This was inspected and found to be hemostatic. The suprapubic incision was opened at the fascia for a distance of 3-4 cm so that the Endo Catch bag could be placed in through the trocar, the specimen extracted inside the Endo Catch bag and it was removed intact. The pelvis was reinspected confirmed as hemostatic, irrigated sufficiently that hemostasis could be confirmed. Laparoscopic instruments were were removed, the fascia closed at the umbilicus and suprapubically and then all 3 trocar sites closed with subcuticular 4-0 Vicryl. Patient's blood type is confirmed again as Rh+.  The fascial closure was was performed with 0 Vicryl and subcuticular 4-0  Vicryl used to close the skin.  Patient will be allowed to go home she is given one week out of work note Patient was allowed to go recovery room in stable condition  PLAN OF CARE: Discharge to home after PACU  PATIENT DISPOSITION:  PACU - hemodynamically stable.   Delay start of Pharmacological VTE agent (>24hrs) due to surgical blood loss or risk of bleeding: not applicable

## 2013-03-19 NOTE — Anesthesia Postprocedure Evaluation (Signed)
  Anesthesia Post-op Note  Patient: Katherine Simmons  Procedure(s) Performed: Procedure(s): LAPAROSCOPIC LEFT SALPINGECTOMY (Left)  Patient Location: PACU  Anesthesia Type:General  Level of Consciousness: awake, alert  and oriented  Airway and Oxygen Therapy: Patient Spontanous Breathing and Patient connected to face mask oxygen  Post-op Pain: moderate  Post-op Assessment: Post-op Vital signs reviewed, Patient's Cardiovascular Status Stable, Respiratory Function Stable, Patent Airway and No signs of Nausea or vomiting  Post-op Vital Signs: Reviewed and stable  Complications: No apparent anesthesia complications

## 2013-03-19 NOTE — Anesthesia Procedure Notes (Signed)
Procedure Name: Intubation Date/Time: 03/19/2013 2:53 PM Performed by: Despina Hidden Pre-anesthesia Checklist: Emergency Drugs available, Patient identified, Suction available and Patient being monitored Patient Re-evaluated:Patient Re-evaluated prior to inductionOxygen Delivery Method: Circle system utilized Preoxygenation: Pre-oxygenation with 100% oxygen Intubation Type: IV induction and Cricoid Pressure applied Ventilation: Mask ventilation without difficulty and Oral airway inserted - appropriate to patient size Laryngoscope Size: Mac and 3 Grade View: Grade I Tube type: Oral Tube size: 7.0 mm Number of attempts: 1 Airway Equipment and Method: Stylet Placement Confirmation: ETT inserted through vocal cords under direct vision,  positive ETCO2 and breath sounds checked- equal and bilateral Secured at: 24 cm Tube secured with: Tape Dental Injury: Teeth and Oropharynx as per pre-operative assessment

## 2013-03-19 NOTE — Transfer of Care (Signed)
Immediate Anesthesia Transfer of Care Note  Patient: Katherine Simmons  Procedure(s) Performed: Procedure(s): LAPAROSCOPIC LEFT SALPINGECTOMY (Left)  Patient Location: PACU  Anesthesia Type:General  Level of Consciousness: awake, alert  and oriented  Airway & Oxygen Therapy: Patient Spontanous Breathing and Patient connected to face mask oxygen  Post-op Assessment: Report given to PACU RN  Post vital signs: Reviewed and stable  Complications: No apparent anesthesia complications

## 2013-03-19 NOTE — Anesthesia Preprocedure Evaluation (Signed)
Anesthesia Evaluation  Patient identified by MRN, date of birth, ID band Patient awake    Reviewed: Allergy & Precautions, H&P , NPO status , Patient's Chart, lab work & pertinent test results  Airway Mallampati: I TM Distance: >3 FB     Dental  (+) Teeth Intact and Dental Advisory Given   Pulmonary shortness of breath and with exertion, pneumonia - (aspiration post seizure), resolved,  breath sounds clear to auscultation        Cardiovascular negative cardio ROS  Rhythm:Regular Rate:Normal     Neuro/Psych  Headaches, Seizures -, Well Controlled,  Last seizure in feb. 2011    GI/Hepatic negative GI ROS,   Endo/Other  Morbid obesity  Renal/GU Renal disease     Musculoskeletal   Abdominal   Peds  Hematology   Anesthesia Other Findings   Reproductive/Obstetrics                           Anesthesia Physical Anesthesia Plan  ASA: III  Anesthesia Plan: General   Post-op Pain Management:    Induction: Intravenous  Airway Management Planned: Oral ETT  Additional Equipment:   Intra-op Plan:   Post-operative Plan: Extubation in OR  Informed Consent: I have reviewed the patients History and Physical, chart, labs and discussed the procedure including the risks, benefits and alternatives for the proposed anesthesia with the patient or authorized representative who has indicated his/her understanding and acceptance.     Plan Discussed with:   Anesthesia Plan Comments:         Anesthesia Quick Evaluation

## 2013-03-19 NOTE — H&P (Signed)
Related encounter: Office Visit from 03/18/2013 in [facility]   Patient ID: JERE VANBUREN, female DOB: 16-Jan-1986, 27 y.o. MRN: [id number]   [facility] Visit   Patient name: ANIELA CANIGLIA MRN [id number] Date of birth: [birth year]   CC & HPI:   KALYSE MEHARG is a 27 y.o. female G57 p51 presenting today for 3 day f/u for left ECTOPIC PREGNANCY TX'D WITH MTX IN [facility] MAU 03/14/13. HAVING increase in pain over weekend and this am. Ultrasound repeated, showing a dramatic increase in size and discomfort of enlarged ectopic complex on left. Patient is aware that this makes salvage of tube less likely. Tubal complex shows generous blood supply to mass. No free fluid appreciated.  Patient too uncomfortable for continued medical management.  Stat Quant hcg ordered, results 3325, down from 3628, but pain is too much for pt to tolerate.  Pt will be given note for being out this week.   ROS:   Prior surgery for rt ectopic (salpingectomy) ,pt wants to save this tube if surgery required. Patient informed of difficult of salvage given the large distorted size of adnexal mass.  Had lightheaded episode with BM last Saturday.(pain)   Pertinent History Reviewed:   Medical & Surgical Hx: Reviewed: Significant for see pmh  Medications: Reviewed & Updated - see associated section  Social History: Reviewed - reports that she has been smoking Cigarettes. She has a 6 pack-year smoking history. She has never used smokeless tobacco.   Objective Findings:   Vitals: BP 108/68  Temp(Src) 98.2 F (36.8 C) (Oral)  Ht 5\' 5"  (1.651 m)  Wt 285 lb 6.4 oz (129.457 kg)  BMI 47.49 kg/m2  LMP 02/22/2013  Physical Examination: will do TV u/s  Had lightheaded episode sat.  U/S 8 CM ADNEXAL COMPLEX, INCREASED IN SIZE   Assessment & Plan:   SYMPTOMATIC LEFT ECTOPIC  TO OR; FOR SURGERY Tuesday 1300  Will proceed with laparoscopic removal of pregnancy , probable left salpingectomy

## 2013-03-19 NOTE — Op Note (Signed)
C. the brief operative note for details 

## 2013-03-20 LAB — URINE CULTURE

## 2013-03-21 ENCOUNTER — Ambulatory Visit: Payer: Self-pay | Admitting: Obstetrics & Gynecology

## 2013-03-21 ENCOUNTER — Encounter (HOSPITAL_COMMUNITY): Payer: Self-pay | Admitting: Obstetrics and Gynecology

## 2013-03-22 ENCOUNTER — Ambulatory Visit: Payer: Self-pay | Admitting: Obstetrics & Gynecology

## 2013-03-25 ENCOUNTER — Telehealth: Payer: Self-pay | Admitting: Obstetrics and Gynecology

## 2013-03-25 NOTE — Telephone Encounter (Signed)
Pt informed per Cyril Mourning, NP, Dr Emelda Fear out of office this am, pt usually needs 2 weeks recovery time from this type of surgery, will discuss with Dr. Emelda Fear this pm and call pt back. Pt verbalized understanding.

## 2013-03-25 NOTE — Telephone Encounter (Signed)
Pt informed Dr. Emelda Fear gave the ok for pt to return to work today with no restriction. Note Faxed to [phone] per pt request.

## 2013-04-01 ENCOUNTER — Encounter (INDEPENDENT_AMBULATORY_CARE_PROVIDER_SITE_OTHER): Payer: Self-pay

## 2013-04-01 ENCOUNTER — Encounter: Payer: Self-pay | Admitting: Obstetrics and Gynecology

## 2013-04-01 ENCOUNTER — Ambulatory Visit (INDEPENDENT_AMBULATORY_CARE_PROVIDER_SITE_OTHER): Payer: Self-pay | Admitting: Obstetrics and Gynecology

## 2013-04-01 VITALS — BP 130/80 | Ht 65.0 in | Wt 286.6 lb

## 2013-04-01 DIAGNOSIS — Z9889 Other specified postprocedural states: Secondary | ICD-10-CM

## 2013-04-01 DIAGNOSIS — O00109 Unspecified tubal pregnancy without intrauterine pregnancy: Secondary | ICD-10-CM

## 2013-04-01 NOTE — Progress Notes (Signed)
Patient ID: Katherine Simmons, female   DOB: 05-Jan-1986, 27 y.o.   MRN: [id number] Pt here today for post op appointment, Ruptured Left Tubal Ectopic Pregnancy.  The patient returned to 2 weeks after left salpingectomy. She is now considered sterile having had a prior right salpingectomy.Patient is made aware of in vitro options and may pursue this after age 89.  No abdominal complaints. Had minimal bleeding after ectopic surgery, had minimal menstrual bleeding also. Physical exam: Abdominal incisions well-healed No pain with intercourse. Assessment normal postop visit status post salpingectomy Plan: Routine GYN care refer to infertility specialists when necessary desire for IVF in consultation

## 2013-04-01 NOTE — Assessment & Plan Note (Addendum)
Left salpingectomy performed 10/14/2014history of prior right salpingectomy 2013

## 2014-04-07 ENCOUNTER — Encounter: Payer: Self-pay | Admitting: Obstetrics and Gynecology

## 2014-09-22 ENCOUNTER — Ambulatory Visit (INDEPENDENT_AMBULATORY_CARE_PROVIDER_SITE_OTHER): Payer: BLUE CROSS/BLUE SHIELD | Admitting: Obstetrics & Gynecology

## 2014-09-22 ENCOUNTER — Encounter: Payer: Self-pay | Admitting: Obstetrics & Gynecology

## 2014-09-22 ENCOUNTER — Other Ambulatory Visit (HOSPITAL_COMMUNITY)
Admission: RE | Admit: 2014-09-22 | Discharge: 2014-09-22 | Disposition: A | Discharge status: Home / Self Care | Payer: BLUE CROSS/BLUE SHIELD | Source: Ambulatory Visit | Attending: Obstetrics & Gynecology | Admitting: Obstetrics & Gynecology

## 2014-09-22 VITALS — BP 108/80 | HR 80 | Ht 66.0 in | Wt 314.0 lb

## 2014-09-22 DIAGNOSIS — Z01419 Encounter for gynecological examination (general) (routine) without abnormal findings: Secondary | ICD-10-CM | POA: Insufficient documentation

## 2014-09-22 DIAGNOSIS — Z1151 Encounter for screening for human papillomavirus (HPV): Secondary | ICD-10-CM | POA: Insufficient documentation

## 2014-09-22 NOTE — Progress Notes (Signed)
Patient ID: Katherine Simmons, female   DOB: [birth year], 29 y.o.   MRN: [id number] Subjective:     Katherine Simmons is a 29 y.o. female here for a routine exam.  Patient's last menstrual period was 08/17/2014. [id number] Birth Control Method:  Bilateral salpingectomy from 2 ectopics Menstrual Calendar(currently): regular  Current complaints: none.   Current acute medical issues:  Seizure disorder   Recent Gynecologic History Patient's last menstrual period was 08/17/2014. Last Pap: 2014,  normal Last mammogram: ,    Past Medical History  Diagnosis Date  . Scabies   . Ectopic pregnancy   . Seizure last 01/2013    has epilepsy  . Epilepsy 01/2013 last seizure    had quit meds. THey were restarted  . Headache(784.0)   . Infection     UTI    Past Surgical History  Procedure Laterality Date  . Laparoscopic unilateral salpingectomy Right 2013    For ectopic pregnancy  . Therapeutic abortion    . Laparoscopic unilateral salpingectomy Left 03/19/2013    Procedure: LAPAROSCOPIC LEFT SALPINGECTOMY;  Surgeon: Tilda BurrowJohn V Ferguson, MD;  Location: [facility];  Service: Gynecology;  Laterality: Left;    OB History    Gravida Para Term Preterm AB TAB SAB Ectopic Multiple Living   4 1 1  0 2 1  1  0 1      Obstetric Comments   O POSITIVE per old records      History   Social History  . Marital Status: Single    Spouse Name: N/A  . Number of Children: N/A  . Years of Education: N/A   Social History Main Topics  . Smoking status: Current Every Day Smoker -- 0.50 packs/day for 12 years    Types: Cigarettes  . Smokeless tobacco: Never Used  . Alcohol Use: No     Comment: occasional; not in months  . Drug Use: No  . Sexual Activity: Not Currently    Birth Control/ Protection: None   Other Topics Concern  . None   Social History Narrative    Family History  Problem Relation Age of Onset  . Asthma Sister   . Hypertension Maternal Aunt   . Cancer Maternal Grandmother     lung  .  Hearing loss Neg Hx      Current outpatient prescriptions:  .  ibuprofen (ADVIL,MOTRIN) 600 MG tablet, Take 1 tablet (600 mg total) by mouth every 6 (six) hours as needed for pain., Disp: 30 tablet, Rfl: 1 .  acetaminophen (TYLENOL) 500 MG tablet, Take 500 mg by mouth every 6 (six) hours as needed for pain., Disp: , Rfl:  .  levETIRAcetam (KEPPRA) 500 MG tablet, Take 0.5 tablets (250 mg total) by mouth 2 (two) times daily. (Patient not taking: Reported on 09/22/2014), Disp: 60 tablet, Rfl: 0 .  ondansetron (ZOFRAN ODT) 4 MG disintegrating tablet, Take 1 tablet (4 mg total) by mouth every 8 (eight) hours as needed for nausea. (Patient not taking: Reported on 09/22/2014), Disp: 10 tablet, Rfl: 0 .  oxyCODONE-acetaminophen (PERCOCET/ROXICET) 5-325 MG per tablet, Take 1 tablet by mouth every 4 (four) hours as needed for pain. (Patient not taking: Reported on 09/22/2014), Disp: 20 tablet, Rfl: 0  Review of Systems  Review of Systems  Constitutional: Negative for fever, chills, weight loss, malaise/fatigue and diaphoresis.  HENT: Negative for hearing loss, ear pain, nosebleeds, congestion, sore throat, neck pain, tinnitus and ear discharge.   Eyes: Negative for blurred vision, double vision, photophobia, pain, discharge  and redness.  Respiratory: Negative for cough, hemoptysis, sputum production, shortness of breath, wheezing and stridor.   Cardiovascular: Negative for chest pain, palpitations, orthopnea, claudication, leg swelling and PND.  Gastrointestinal: negative for abdominal pain. Negative for heartburn, nausea, vomiting, diarrhea, constipation, blood in stool and melena.  Genitourinary: Negative for dysuria, urgency, frequency, hematuria and flank pain.  Musculoskeletal: Negative for myalgias, back pain, joint pain and falls.  Skin: Negative for itching and rash.  Neurological: Negative for dizziness, tingling, tremors, sensory change, speech change, focal weakness, seizures, loss of  consciousness, weakness and headaches.  Endo/Heme/Allergies: Negative for environmental allergies and polydipsia. Does not bruise/bleed easily.  Psychiatric/Behavioral: Negative for depression, suicidal ideas, hallucinations, memory loss and substance abuse. The patient is not nervous/anxious and does not have insomnia.        Objective:  Blood pressure 108/80, pulse 80, height  (1.676 m), weight 314 lb (142.429 kg), last menstrual period 08/17/2014.   Physical Exam  Vitals reviewed. Constitutional: She is oriented to person, place, and time. She appears well-developed and well-nourished.  HENT:  Head: Normocephalic and atraumatic.        Right Ear: External ear normal.  Left Ear: External ear normal.  Nose: Nose normal.  Mouth/Throat: Oropharynx is clear and moist.  Eyes: Conjunctivae and EOM are normal. Pupils are equal, round, and reactive to light. Right eye exhibits no discharge. Left eye exhibits no discharge. No scleral icterus.  Neck: Normal range of motion. Neck supple. No tracheal deviation present. No thyromegaly present.  Cardiovascular: Normal rate, regular rhythm, normal heart sounds and intact distal pulses.  Exam reveals no gallop and no friction rub.   No murmur heard. Respiratory: Effort normal and breath sounds normal. No respiratory distress. She has no wheezes. She has no rales. She exhibits no tenderness.  GI: Soft. Bowel sounds are normal. She exhibits no distension and no mass. There is no tenderness. There is no rebound and no guarding.  Genitourinary:  Breasts no masses skin changes or nipple changes bilaterally      Vulva is normal without lesions Vagina is pink moist without discharge Cervix normal in appearance and pap is done Uterus is normal size shape and contour Adnexa is negative with normal sized ovaries   Musculoskeletal: Normal range of motion. She exhibits no edema and no tenderness.  Neurological: She is alert and oriented to person, place,  and time. She has normal reflexes. She displays normal reflexes. No cranial nerve deficit. She exhibits normal muscle tone. Coordination normal.  Skin: Skin is warm and dry. No rash noted. No erythema. No pallor.  Psychiatric: She has a normal mood and affect. Her behavior is normal. Judgment and thought content normal.       Assessment:    Healthy female exam.    Plan:   No contraception has bilateral salpingectomiesfollow up 1 year

## 2014-09-22 NOTE — Addendum Note (Signed)
Addended by: Colen DarlingYOUNG, Qunisha Bryk S on: 09/22/2014 04:25 PM   Modules accepted: Orders

## 2014-09-24 LAB — CYTOLOGY - PAP

## 2015-06-29 ENCOUNTER — Other Ambulatory Visit (HOSPITAL_COMMUNITY): Payer: Self-pay | Admitting: Respiratory Therapy

## 2015-06-29 DIAGNOSIS — G4733 Obstructive sleep apnea (adult) (pediatric): Secondary | ICD-10-CM

## 2015-06-29 DIAGNOSIS — G40209 Localization-related (focal) (partial) symptomatic epilepsy and epileptic syndromes with complex partial seizures, not intractable, without status epilepticus: Secondary | ICD-10-CM

## 2015-06-29 DIAGNOSIS — R413 Other amnesia: Secondary | ICD-10-CM

## 2015-07-15 DIAGNOSIS — Z029 Encounter for administrative examinations, unspecified: Secondary | ICD-10-CM

## 2015-08-03 ENCOUNTER — Emergency Department (HOSPITAL_COMMUNITY): Payer: BLUE CROSS/BLUE SHIELD

## 2015-08-03 ENCOUNTER — Encounter (HOSPITAL_COMMUNITY): Payer: Self-pay | Admitting: Emergency Medicine

## 2015-08-03 ENCOUNTER — Emergency Department (HOSPITAL_COMMUNITY)
Admission: EM | Admit: 2015-08-03 | Discharge: 2015-08-03 | Disposition: A | Discharge status: Home / Self Care | Payer: BLUE CROSS/BLUE SHIELD | Attending: Emergency Medicine | Admitting: Emergency Medicine

## 2015-08-03 DIAGNOSIS — Z87440 Personal history of urinary (tract) infections: Secondary | ICD-10-CM | POA: Insufficient documentation

## 2015-08-03 DIAGNOSIS — R0789 Other chest pain: Secondary | ICD-10-CM | POA: Diagnosis not present

## 2015-08-03 DIAGNOSIS — R05 Cough: Secondary | ICD-10-CM

## 2015-08-03 DIAGNOSIS — G40909 Epilepsy, unspecified, not intractable, without status epilepticus: Secondary | ICD-10-CM | POA: Insufficient documentation

## 2015-08-03 DIAGNOSIS — J111 Influenza due to unidentified influenza virus with other respiratory manifestations: Secondary | ICD-10-CM | POA: Insufficient documentation

## 2015-08-03 DIAGNOSIS — Z8619 Personal history of other infectious and parasitic diseases: Secondary | ICD-10-CM | POA: Insufficient documentation

## 2015-08-03 DIAGNOSIS — R059 Cough, unspecified: Secondary | ICD-10-CM

## 2015-08-03 DIAGNOSIS — F17210 Nicotine dependence, cigarettes, uncomplicated: Secondary | ICD-10-CM | POA: Insufficient documentation

## 2015-08-03 DIAGNOSIS — R69 Illness, unspecified: Secondary | ICD-10-CM

## 2015-08-03 DIAGNOSIS — Z79899 Other long term (current) drug therapy: Secondary | ICD-10-CM | POA: Insufficient documentation

## 2015-08-03 MED ORDER — PENICILLIN G BENZATHINE 1200000 UNIT/2ML IM SUSP
1.2000 10*6.[IU] | Freq: Once | INTRAMUSCULAR | Status: AC
  Administered 2015-08-03: 1.2 10*6.[IU] via INTRAMUSCULAR
  Filled 2015-08-03: qty 2

## 2015-08-03 MED ORDER — GUAIFENESIN 100 MG/5ML PO LIQD: 100.0000 mg | ORAL | Status: DC | PRN

## 2015-08-03 MED ORDER — BENZONATATE 100 MG PO CAPS: 100.0000 mg | ORAL_CAPSULE | Freq: Three times a day (TID) | ORAL | Status: DC

## 2015-08-03 NOTE — ED Notes (Addendum)
Patient complaining of fever and cough since Saturday. States she was seen at Urgent Care today and given antibiotics but "I can't stop coughing." States last tylenol use was 1500 today.

## 2015-08-03 NOTE — Discharge Instructions (Signed)

## 2015-08-03 NOTE — ED Provider Notes (Signed)
CSN: [id number]     Arrival date & time 08/03/15  2015 History   First MD Initiated Contact with Patient 08/03/15 2139     Chief Complaint  Patient presents with  . Cough  . Fever     (Consider location/radiation/quality/duration/timing/severity/associated sxs/prior Treatment) HPI Comments: Morbidly obese female with 2 day history of cough, congestion and fever. Seen in urgent care earlier today and given azithromycin for unknown indication. States she took the antibiotics but is still coughing despite taking Robitussin at home. Her main complaint is that she is coughing too much and cannot rest. Cough is relatively clear mucus. Also complains of sore throat, runny nose. Temperature 100. Denies abdominal pain, nausea, vomiting or diarrhea. She did not receive a flu shot. History of epilepsy on Keppra. No focal neurological deficits. Complains of body ache and headache. Some chest tightness with coughing. No shortness of breath or chest pain at rest. Has had sick contacts at work.  Patient is a 30 y.o. female presenting with fever. The history is provided by the patient and a relative.  Fever Associated symptoms: congestion, cough, rhinorrhea and sore throat   Associated symptoms: no chest pain, no dysuria, no headaches, no myalgias, no nausea, no rash and no vomiting     Past Medical History  Diagnosis Date  . Scabies   . Ectopic pregnancy   . Seizure (HCC) last 01/2013    has epilepsy  . Epilepsy (HCC) 01/2013 last seizure    had quit meds. THey were restarted  . Headache(784.0)   . Infection     UTI   Past Surgical History  Procedure Laterality Date  . Laparoscopic unilateral salpingectomy Right 2013    For ectopic pregnancy  . Therapeutic abortion    . Laparoscopic unilateral salpingectomy Left 03/19/2013    Procedure: LAPAROSCOPIC LEFT SALPINGECTOMY;  Surgeon: Tilda Burrow, MD;  Location: [facility];  Service: Gynecology;  Laterality: Left;   Family History  Problem  Relation Age of Onset  . Asthma Sister   . Hypertension Maternal Aunt   . Cancer Maternal Grandmother     lung  . Hearing loss Neg Hx    Social History  Substance Use Topics  . Smoking status: Current Every Day Smoker -- 0.50 packs/day for 12 years    Types: Cigarettes  . Smokeless tobacco: Never Used  . Alcohol Use: 0.5 oz/week    1 Standard drinks or equivalent per week     Comment: occasional; not in months   OB History    Gravida Para Term Preterm AB TAB SAB Ectopic Multiple Living   0 0 1      Obstetric Comments   O POSITIVE per old records     Review of Systems  Constitutional: Positive for fever, activity change and appetite change.  HENT: Positive for congestion, rhinorrhea and sore throat.   Respiratory: Positive for cough and chest tightness. Negative for shortness of breath.   Cardiovascular: Negative for chest pain.  Gastrointestinal: Negative for nausea, vomiting and abdominal pain.  Genitourinary: Negative for dysuria, hematuria, vaginal bleeding and vaginal discharge.  Musculoskeletal: Negative for myalgias and arthralgias.  Skin: Negative for rash.  Neurological: Negative for dizziness, weakness, light-headedness and headaches.  A complete 10 system review of systems was obtained and all systems are negative except as noted in the HPI and PMH.      Allergies  Review of patient's allergies indicates no known allergies.  Home Medications  Prior to Admission medications   Medication Sig Start Date End Date Taking? Authorizing Provider  acetaminophen (TYLENOL) 500 MG tablet Take 500 mg by mouth every 6 (six) hours as needed for pain.   Yes Historical Provider, MD  azithromycin (ZITHROMAX) 250 MG tablet Take 250-500 mg by mouth See admin instructions. 2 tabs on day 1, starting on 08/03/15, then take 1 tablet on days 2 through 5 08/03/15  Yes Historical Provider, MD  ibuprofen (ADVIL,MOTRIN) 800 MG tablet Take 800 mg by mouth every 8 (eight) hours  as needed for mild pain or moderate pain.   Yes Historical Provider, MD  levETIRAcetam (KEPPRA XR) 500 MG 24 hr tablet Take 1,000 mg by mouth daily. 07/20/15  Yes Historical Provider, MD  benzonatate (TESSALON) 100 MG capsule Take 1 capsule (100 mg total) by mouth every 8 (eight) hours. 08/03/15   Glynn Octave, MD  guaiFENesin (ROBITUSSIN) 100 MG/5ML liquid Take 5-10 mLs (100-200 mg total) by mouth every 4 (four) hours as needed for cough. 08/03/15   Glynn Octave, MD   BP 134/83 mmHg  Pulse 99  Temp(Src) 99 F (37.2 C) (Oral)  Resp 22  Ht 5\' 5"  (1.651 m)  Wt 330 lb (149.687 kg)  BMI 54.91 kg/m2  SpO2 99%  LMP 08/02/2015 Physical Exam  Constitutional: She is oriented to person, place, and time. She appears well-developed and well-nourished. No distress.  Morbidly obese  HENT:  Head: Normocephalic and atraumatic.  Mouth/Throat: Oropharynx is clear and moist. No oropharyngeal exudate.  Erythematous oropharynx. No asymmetry, no exudate  Eyes: Conjunctivae and EOM are normal. Pupils are equal, round, and reactive to light.  Neck: Normal range of motion. Neck supple.  No meningismus.  Cardiovascular: Normal rate, regular rhythm, normal heart sounds and intact distal pulses.   No murmur heard. Pulmonary/Chest: Effort normal and breath sounds normal. No respiratory distress. She has no wheezes.  Limited exam due to body habitus.  Abdominal: Soft. There is no tenderness. There is no rebound and no guarding.  Musculoskeletal: Normal range of motion. She exhibits no edema or tenderness.  Neurological: She is alert and oriented to person, place, and time. No cranial nerve deficit. She exhibits normal muscle tone. Coordination normal.  No ataxia on finger to nose bilaterally. No pronator drift. 5/5 strength throughout. CN 2-12 intact.Equal grip strength. Sensation intact.   Skin: Skin is warm.  Psychiatric: She has a normal mood and affect. Her behavior is normal.  Nursing note and vitals  reviewed.   ED Course  Procedures (including critical care time) Labs Review Labs Reviewed - No data to display  Imaging Review Dg Chest 2 View  08/03/2015  CLINICAL DATA:  Cough and fever and body aches. EXAM: CHEST  2 VIEW COMPARISON:  8/18/4 FINDINGS: The heart size and mediastinal contours are within normal limits. Both lungs are clear. No pleural effusion or pneumothorax. The visualized skeletal structures are unremarkable. IMPRESSION: No active cardiopulmonary disease. Electronically Signed   By: Amie Portland M.D.   On: 08/03/2015 21:06   I have personally reviewed and evaluated these images and lab results as part of my medical decision-making.   EKG Interpretation None      MDM   Final diagnoses:  Influenza-like illness  Cough   2 days of fever cough and body aches. Seen at urgent care and given Zithromax for unclear indication. Did not receive flu shot.  Nontoxic and well-appearing. Chest x-ray in triage is negative. Suspect viral syndrome, possibly influenza.  Discussed supportive  care, antipyretics, by mouth hydration, needs to establish care with PCP. Return precautions discussed.     Glynn Octave, MD 08/03/15 2216

## 2015-09-24 ENCOUNTER — Ambulatory Visit (INDEPENDENT_AMBULATORY_CARE_PROVIDER_SITE_OTHER): Payer: BLUE CROSS/BLUE SHIELD | Admitting: Obstetrics & Gynecology

## 2015-09-24 ENCOUNTER — Encounter: Payer: Self-pay | Admitting: Obstetrics & Gynecology

## 2015-09-24 ENCOUNTER — Other Ambulatory Visit (HOSPITAL_COMMUNITY)
Admission: RE | Admit: 2015-09-24 | Discharge: 2015-09-24 | Disposition: A | Discharge status: Home / Self Care | Payer: BLUE CROSS/BLUE SHIELD | Source: Ambulatory Visit | Attending: Obstetrics & Gynecology | Admitting: Obstetrics & Gynecology

## 2015-09-24 VITALS — BP 120/80 | HR 92 | Ht 65.0 in | Wt 344.0 lb

## 2015-09-24 DIAGNOSIS — Z01419 Encounter for gynecological examination (general) (routine) without abnormal findings: Secondary | ICD-10-CM | POA: Insufficient documentation

## 2015-09-24 NOTE — Progress Notes (Signed)
Patient ID: Katherine Simmons, female   DOB: 02/24/1986, 30 y.o.   MRN: [id number] Subjective:     Katherine Simmons is a 30 y.o. female here for a routine exam.  Patient's last menstrual period was 09/23/2015. [id number] Birth Control Method:  none Menstrual Calendar(currently): regular  Current complaints: none, eval for breast reduction.   Current acute medical issues:     Recent Gynecologic History Patient's last menstrual period was 09/23/2015. Last Pap: 2016,  normal Last mammogram: na,    Past Medical History  Diagnosis Date  . Scabies   . Ectopic pregnancy   . Seizure (HCC) last 01/2013    has epilepsy  . Epilepsy (HCC) 01/2013 last seizure    had quit meds. THey were restarted  . Headache(784.0)   . Infection     UTI    Past Surgical History  Procedure Laterality Date  . Laparoscopic unilateral salpingectomy Right 2013    For ectopic pregnancy  . Therapeutic abortion    . Laparoscopic unilateral salpingectomy Left 03/19/2013    Procedure: LAPAROSCOPIC LEFT SALPINGECTOMY;  Surgeon: Tilda Burrow, MD;  Location: [facility];  Service: Gynecology;  Laterality: Left;    OB History    Gravida Para Term Preterm AB TAB SAB Ectopic Multiple Living   0 0 1      Obstetric Comments   O POSITIVE per old records      Social History   Social History  . Marital Status: Single    Spouse Name: N/A  . Number of Children: N/A  . Years of Education: N/A   Social History Main Topics  . Smoking status: Current Every Day Smoker -- 0.50 packs/day for 12 years    Types: Cigarettes  . Smokeless tobacco: Never Used  . Alcohol Use: 0.5 oz/week    1 Standard drinks or equivalent per week     Comment: occasional; not in months  . Drug Use: No  . Sexual Activity: Not Currently    Birth Control/ Protection: None   Other Topics Concern  . None   Social History Narrative    Family History  Problem Relation Age of Onset  . Asthma Sister   . Hypertension Maternal Aunt    . Cancer Maternal Grandmother     lung  . Hearing loss Neg Hx      Current outpatient prescriptions:  .  ibuprofen (ADVIL,MOTRIN) 800 MG tablet, Take 800 mg by mouth every 8 (eight) hours as needed for mild pain or moderate pain., Disp: , Rfl:  .  levETIRAcetam (KEPPRA XR) 500 MG 24 hr tablet, Take 1,000 mg by mouth daily., Disp: , Rfl: 1  Review of Systems  Review of Systems  Constitutional: Negative for fever, chills, weight loss, malaise/fatigue and diaphoresis.  HENT: Negative for hearing loss, ear pain, nosebleeds, congestion, sore throat, neck pain, tinnitus and ear discharge.   Eyes: Negative for blurred vision, double vision, photophobia, pain, discharge and redness.  Respiratory: Negative for cough, hemoptysis, sputum production, shortness of breath, wheezing and stridor.   Cardiovascular: Negative for chest pain, palpitations, orthopnea, claudication, leg swelling and PND.  Gastrointestinal: negative for abdominal pain. Negative for heartburn, nausea, vomiting, diarrhea, constipation, blood in stool and melena.  Genitourinary: Negative for dysuria, urgency, frequency, hematuria and flank pain.  Musculoskeletal: Negative for myalgias, back pain, joint pain and falls.  Skin: Negative for itching and rash.  Neurological: Negative for dizziness, tingling, tremors, sensory change, speech change, focal  weakness, seizures, loss of consciousness, weakness and headaches.  Endo/Heme/Allergies: Negative for environmental allergies and polydipsia. Does not bruise/bleed easily.  Psychiatric/Behavioral: Negative for depression, suicidal ideas, hallucinations, memory loss and substance abuse. The patient is not nervous/anxious and does not have insomnia.        Objective:  Blood pressure 120/80, pulse 92, height 5\' 5"  (1.651 m), weight 344 lb (156.037 kg), last menstrual period 09/23/2015.   Physical Exam  Vitals reviewed. Constitutional: She is oriented to person, place, and time. She  appears well-developed and well-nourished.  HENT:  Head: Normocephalic and atraumatic.        Right Ear: External ear normal.  Left Ear: External ear normal.  Nose: Nose normal.  Mouth/Throat: Oropharynx is clear and moist.  Eyes: Conjunctivae and EOM are normal. Pupils are equal, round, and reactive to light. Right eye exhibits no discharge. Left eye exhibits no discharge. No scleral icterus.  Neck: Normal range of motion. Neck supple. No tracheal deviation present. No thyromegaly present.  Cardiovascular: Normal rate, regular rhythm, normal heart sounds and intact distal pulses.  Exam reveals no gallop and no friction rub.   No murmur heard. Respiratory: Effort normal and breath sounds normal. No respiratory distress. She has no wheezes. She has no rales. She exhibits no tenderness.  GI: Soft. Bowel sounds are normal. She exhibits no distension and no mass. There is no tenderness. There is no rebound and no guarding.  Genitourinary:  Breasts no masses skin changes or nipple changes bilaterally      Vulva is normal without lesions Vagina is pink moist without discharge Cervix normal in appearance and pap is done Uterus is normal size shape and contour Adnexa is negative with normal sized ovaries   Musculoskeletal: Normal range of motion. She exhibits no edema and no tenderness.  Neurological: She is alert and oriented to person, place, and time. She has normal reflexes. She displays normal reflexes. No cranial nerve deficit. She exhibits normal muscle tone. Coordination normal.  Skin: Skin is warm and dry. No rash noted. No erythema. No pallor.  Psychiatric: She has a normal mood and affect. Her behavior is normal. Judgment and thought content normal.       Medications Ordered at today's visit: No orders of the defined types were placed in this encounter.    Other orders placed at today's visit: No orders of the defined types were placed in this encounter.      Assessment:     Healthy female exam.    Plan:    Follow up in: 1 year.

## 2015-09-25 LAB — CYTOLOGY - PAP

## 2015-10-26 ENCOUNTER — Encounter (HOSPITAL_COMMUNITY): Payer: Self-pay | Admitting: *Deleted

## 2015-10-26 ENCOUNTER — Other Ambulatory Visit (HOSPITAL_COMMUNITY): Payer: Self-pay | Admitting: Plastic Surgery

## 2015-10-26 MED ORDER — DEXTROSE 5 % IV SOLN
3.0000 g | INTRAVENOUS | Status: AC
  Administered 2015-10-27: 3 g via INTRAVENOUS
  Filled 2015-10-26: qty 3000

## 2015-10-26 MED ORDER — HEPARIN SODIUM (PORCINE) 5000 UNIT/ML IJ SOLN
5000.0000 [IU] | Freq: Once | INTRAMUSCULAR | Status: AC
  Administered 2015-10-27: 5000 [IU] via SUBCUTANEOUS

## 2015-10-26 NOTE — Progress Notes (Signed)
Pt denies SOB, chest pain, and being under the care of a cardiologist. Pt denies having a stress test, echo and cardiac cath. Pt denies having an EKG within the last year. Pt made aware to stop Stop taking Aspirin, Coumadin, Plavix, Effient, and herbal medications. Do not take any NSAIDs ie: Ibuprofen, Advil, Naproxen, BC and Goody Powder or any medication containing Aspirin. Pt verbalized understanding of all pre-op instructions.

## 2015-10-26 NOTE — Progress Notes (Signed)
Pt made aware to stop taking vitamins.

## 2015-10-27 ENCOUNTER — Ambulatory Visit (HOSPITAL_COMMUNITY)
Admission: RE | Admit: 2015-10-27 | Discharge: 2015-10-28 | Disposition: A | Discharge status: Home / Self Care | Payer: BLUE CROSS/BLUE SHIELD | Source: Ambulatory Visit | Attending: Plastic Surgery | Admitting: Plastic Surgery

## 2015-10-27 ENCOUNTER — Ambulatory Visit (HOSPITAL_COMMUNITY): Payer: BLUE CROSS/BLUE SHIELD | Admitting: Certified Registered Nurse Anesthetist

## 2015-10-27 ENCOUNTER — Encounter (HOSPITAL_COMMUNITY): Payer: Self-pay | Admitting: Certified Registered Nurse Anesthetist

## 2015-10-27 ENCOUNTER — Encounter (HOSPITAL_COMMUNITY)
Admission: RE | Disposition: A | Discharge status: Home / Self Care | Payer: Self-pay | Source: Ambulatory Visit | Attending: Plastic Surgery

## 2015-10-27 DIAGNOSIS — Z87891 Personal history of nicotine dependence: Secondary | ICD-10-CM | POA: Diagnosis not present

## 2015-10-27 DIAGNOSIS — N62 Hypertrophy of breast: Secondary | ICD-10-CM | POA: Diagnosis present

## 2015-10-27 DIAGNOSIS — Z6841 Body Mass Index (BMI) 40.0 and over, adult: Secondary | ICD-10-CM | POA: Insufficient documentation

## 2015-10-27 DIAGNOSIS — M542 Cervicalgia: Secondary | ICD-10-CM | POA: Diagnosis not present

## 2015-10-27 DIAGNOSIS — M25511 Pain in right shoulder: Secondary | ICD-10-CM | POA: Insufficient documentation

## 2015-10-27 DIAGNOSIS — M25512 Pain in left shoulder: Secondary | ICD-10-CM | POA: Diagnosis not present

## 2015-10-27 HISTORY — DX: Other generalized epilepsy and epileptic syndromes, not intractable, without status epilepticus: G40.409

## 2015-10-27 HISTORY — DX: Hypertrophy of breast: N62

## 2015-10-27 HISTORY — PX: BREAST REDUCTION SURGERY: SHX8

## 2015-10-27 HISTORY — DX: Migraine, unspecified, not intractable, without status migrainosus: G43.909

## 2015-10-27 HISTORY — PX: REDUCTION MAMMAPLASTY: SUR839

## 2015-10-27 HISTORY — DX: Generalized idiopathic epilepsy and epileptic syndromes, not intractable, without status epilepticus: G40.309

## 2015-10-27 LAB — CBC
HEMATOCRIT: 41.2 % (ref 36.0–46.0)
HEMOGLOBIN: 13.6 g/dL (ref 12.0–15.0)
MCH: 30.2 pg (ref 26.0–34.0)
MCHC: 33 g/dL (ref 30.0–36.0)
MCV: 91.6 fL (ref 78.0–100.0)
Platelets: 299 10*3/uL (ref 150–400)
RBC: 4.5 MIL/uL (ref 3.87–5.11)
RDW: 13.7 % (ref 11.5–15.5)
WBC: 11.3 10*3/uL — AB (ref 4.0–10.5)

## 2015-10-27 LAB — SURGICAL PCR SCREEN
MRSA, PCR: NEGATIVE
STAPHYLOCOCCUS AUREUS: NEGATIVE

## 2015-10-27 LAB — BASIC METABOLIC PANEL
ANION GAP: 10 (ref 5–15)
BUN: 12 mg/dL (ref 6–20)
CHLORIDE: 103 mmol/L (ref 101–111)
CO2: 25 mmol/L (ref 22–32)
Calcium: 9.3 mg/dL (ref 8.9–10.3)
Creatinine, Ser: 0.88 mg/dL (ref 0.44–1.00)
GFR calc non Af Amer: >60 mL/min (ref >60)
Glucose, Bld: 110 mg/dL — ABNORMAL HIGH (ref 65–99)
POTASSIUM: 4.1 mmol/L (ref 3.5–5.1)
Sodium: 138 mmol/L (ref 135–145)

## 2015-10-27 LAB — HCG, SERUM, QUALITATIVE: PREG SERUM: NEGATIVE

## 2015-10-27 SURGERY — MAMMOPLASTY, REDUCTION
Anesthesia: General | Laterality: Bilateral

## 2015-10-27 MED ORDER — PHENYLEPHRINE 40 MCG/ML (10ML) SYRINGE FOR IV PUSH (FOR BLOOD PRESSURE SUPPORT)
PREFILLED_SYRINGE | INTRAVENOUS | Status: AC
  Filled 2015-10-27: qty 10

## 2015-10-27 MED ORDER — 0.9 % SODIUM CHLORIDE (POUR BTL) OPTIME
TOPICAL | Status: DC | PRN
  Administered 2015-10-27: 3000 mL

## 2015-10-27 MED ORDER — ACETAMINOPHEN 10 MG/ML IV SOLN
INTRAVENOUS | Status: AC
  Filled 2015-10-27: qty 100

## 2015-10-27 MED ORDER — HYDROMORPHONE HCL 1 MG/ML IJ SOLN
0.5000 mg | INTRAMUSCULAR | Status: DC | PRN
Start: 2015-10-27 — End: 2015-10-28
  Administered 2015-10-27 (×2): 1 mg via INTRAVENOUS
  Filled 2015-10-27 (×2): qty 1

## 2015-10-27 MED ORDER — DOCUSATE SODIUM 100 MG PO CAPS
100.0000 mg | ORAL_CAPSULE | Freq: Every day | ORAL | Status: DC
  Administered 2015-10-27 – 2015-10-28 (×2): 100 mg via ORAL
  Filled 2015-10-27 (×2): qty 1

## 2015-10-27 MED ORDER — SUGAMMADEX SODIUM 500 MG/5ML IV SOLN
INTRAVENOUS | Status: AC
  Filled 2015-10-27: qty 5

## 2015-10-27 MED ORDER — ROCURONIUM BROMIDE 100 MG/10ML IV SOLN
INTRAVENOUS | Status: DC | PRN
  Administered 2015-10-27: 25 mg via INTRAVENOUS
  Administered 2015-10-27: 15 mg via INTRAVENOUS
  Administered 2015-10-27: 50 mg via INTRAVENOUS

## 2015-10-27 MED ORDER — HEPARIN SODIUM (PORCINE) 5000 UNIT/ML IJ SOLN
INTRAMUSCULAR | Status: AC
Start: 2015-10-27 — End: 2015-10-27
  Administered 2015-10-27: 5000 [IU] via SUBCUTANEOUS
  Filled 2015-10-27: qty 1

## 2015-10-27 MED ORDER — ONDANSETRON HCL 4 MG/2ML IJ SOLN
INTRAMUSCULAR | Status: DC | PRN
  Administered 2015-10-27: 4 mg via INTRAVENOUS

## 2015-10-27 MED ORDER — LACTATED RINGERS IV SOLN
INTRAVENOUS | Status: DC | PRN
  Administered 2015-10-27 (×2): via INTRAVENOUS

## 2015-10-27 MED ORDER — LEVETIRACETAM ER 500 MG PO TB24
1000.0000 mg | ORAL_TABLET | Freq: Every day | ORAL | Status: DC
  Administered 2015-10-28: 1000 mg via ORAL
  Filled 2015-10-27: qty 2

## 2015-10-27 MED ORDER — SUGAMMADEX SODIUM 500 MG/5ML IV SOLN
INTRAVENOUS | Status: DC | PRN
  Administered 2015-10-27: 300 mg via INTRAVENOUS

## 2015-10-27 MED ORDER — ROCURONIUM BROMIDE 50 MG/5ML IV SOLN
INTRAVENOUS | Status: AC
Start: 2015-10-27 — End: 2015-10-27
  Filled 2015-10-27: qty 1

## 2015-10-27 MED ORDER — ACETAMINOPHEN 325 MG PO TABS: 650.0000 mg | ORAL_TABLET | Freq: Four times a day (QID) | ORAL | Status: DC | PRN

## 2015-10-27 MED ORDER — ACETAMINOPHEN 10 MG/ML IV SOLN
INTRAVENOUS | Status: DC | PRN
  Administered 2015-10-27: 1000 mg via INTRAVENOUS

## 2015-10-27 MED ORDER — METHOCARBAMOL 500 MG PO TABS
500.0000 mg | ORAL_TABLET | Freq: Four times a day (QID) | ORAL | Status: DC | PRN
  Administered 2015-10-27 – 2015-10-28 (×2): 500 mg via ORAL
  Filled 2015-10-27 (×2): qty 1

## 2015-10-27 MED ORDER — FENTANYL CITRATE (PF) 250 MCG/5ML IJ SOLN
INTRAMUSCULAR | Status: AC
  Filled 2015-10-27: qty 5

## 2015-10-27 MED ORDER — MIDAZOLAM HCL 5 MG/5ML IJ SOLN
INTRAMUSCULAR | Status: DC | PRN
  Administered 2015-10-27: 2 mg via INTRAVENOUS

## 2015-10-27 MED ORDER — DIPHENHYDRAMINE HCL 50 MG/ML IJ SOLN
INTRAMUSCULAR | Status: DC | PRN
  Administered 2015-10-27: 25 mg via INTRAVENOUS

## 2015-10-27 MED ORDER — SUCCINYLCHOLINE CHLORIDE 20 MG/ML IJ SOLN
INTRAMUSCULAR | Status: DC | PRN
  Administered 2015-10-27: 160 mg via INTRAVENOUS

## 2015-10-27 MED ORDER — DEXAMETHASONE SODIUM PHOSPHATE 10 MG/ML IJ SOLN
INTRAMUSCULAR | Status: DC | PRN
  Administered 2015-10-27: 10 mg via INTRAVENOUS

## 2015-10-27 MED ORDER — PROPOFOL 10 MG/ML IV BOLUS
INTRAVENOUS | Status: AC
  Filled 2015-10-27: qty 20

## 2015-10-27 MED ORDER — HYDROMORPHONE HCL 1 MG/ML IJ SOLN
INTRAMUSCULAR | Status: AC
  Administered 2015-10-27: 1 mg via INTRAVENOUS
  Filled 2015-10-27: qty 1

## 2015-10-27 MED ORDER — LIDOCAINE HCL (CARDIAC) 20 MG/ML IV SOLN
INTRAVENOUS | Status: DC | PRN
  Administered 2015-10-27: 80 mg via INTRAVENOUS

## 2015-10-27 MED ORDER — HYDROMORPHONE HCL 2 MG PO TABS
2.0000 mg | ORAL_TABLET | ORAL | Status: DC | PRN
  Administered 2015-10-27 – 2015-10-28 (×4): 4 mg via ORAL
  Filled 2015-10-27 (×4): qty 2

## 2015-10-27 MED ORDER — ONDANSETRON HCL 4 MG/2ML IJ SOLN: 4.0000 mg | Freq: Four times a day (QID) | INTRAMUSCULAR | Status: DC | PRN

## 2015-10-27 MED ORDER — HYDROMORPHONE HCL 1 MG/ML IJ SOLN
0.2500 mg | INTRAMUSCULAR | Status: DC | PRN
  Administered 2015-10-27: 0.25 mg via INTRAVENOUS
  Administered 2015-10-27: 0.5 mg via INTRAVENOUS
  Administered 2015-10-27: 0.25 mg via INTRAVENOUS

## 2015-10-27 MED ORDER — ENOXAPARIN SODIUM 40 MG/0.4ML ~~LOC~~ SOLN
40.0000 mg | SUBCUTANEOUS | Status: DC
  Administered 2015-10-28: 40 mg via SUBCUTANEOUS
  Filled 2015-10-27: qty 0.4

## 2015-10-27 MED ORDER — DEXTROSE 5 % IV SOLN
3.0000 g | Freq: Three times a day (TID) | INTRAVENOUS | Status: DC
  Administered 2015-10-27 – 2015-10-28 (×3): 3 g via INTRAVENOUS
  Filled 2015-10-27 (×6): qty 3000

## 2015-10-27 MED ORDER — PROPOFOL 10 MG/ML IV BOLUS
INTRAVENOUS | Status: DC | PRN
  Administered 2015-10-27: 200 mg via INTRAVENOUS

## 2015-10-27 MED ORDER — ONDANSETRON HCL 4 MG/2ML IJ SOLN
INTRAMUSCULAR | Status: AC
Start: 2015-10-27 — End: 2015-10-27
  Filled 2015-10-27: qty 2

## 2015-10-27 MED ORDER — FENTANYL CITRATE (PF) 100 MCG/2ML IJ SOLN
INTRAMUSCULAR | Status: DC | PRN
  Administered 2015-10-27: 50 ug via INTRAVENOUS
  Administered 2015-10-27: 100 ug via INTRAVENOUS
  Administered 2015-10-27 (×3): 50 ug via INTRAVENOUS

## 2015-10-27 MED ORDER — DEXTROSE-NACL 5-0.45 % IV SOLN
INTRAVENOUS | Status: DC
  Administered 2015-10-27 (×2): via INTRAVENOUS

## 2015-10-27 MED ORDER — MIDAZOLAM HCL 2 MG/2ML IJ SOLN
INTRAMUSCULAR | Status: AC
  Filled 2015-10-27: qty 2

## 2015-10-27 SURGICAL SUPPLY — 51 items
ADH SKN CLS APL DERMABOND .7 (GAUZE/BANDAGES/DRESSINGS) ×2
ATCH SMKEVC FLXB CAUT HNDSWH (FILTER) ×1 IMPLANT
BALL CTTN LRG ABS STRL LF (GAUZE/BANDAGES/DRESSINGS) ×2
BANDAGE ACE 6X5 VEL STRL LF (GAUZE/BANDAGES/DRESSINGS) ×3 IMPLANT
BINDER BREAST LRG (GAUZE/BANDAGES/DRESSINGS) IMPLANT
BINDER BREAST XLRG (GAUZE/BANDAGES/DRESSINGS) IMPLANT
BINDER BREAST XXLRG (GAUZE/BANDAGES/DRESSINGS) ×2 IMPLANT
BNDG CMPR MED 15X6 ELC VLCR LF (GAUZE/BANDAGES/DRESSINGS) ×1
BNDG ELASTIC 6X15 VLCR STRL LF (GAUZE/BANDAGES/DRESSINGS) ×2 IMPLANT
CANISTER SUCTION 2500CC (MISCELLANEOUS) ×3 IMPLANT
CHLORAPREP W/TINT 26ML (MISCELLANEOUS) ×5 IMPLANT
COTTONBALL LRG STERILE PKG (GAUZE/BANDAGES/DRESSINGS) ×4 IMPLANT
COVER SURGICAL LIGHT HANDLE (MISCELLANEOUS) ×3 IMPLANT
DERMABOND ADVANCED (GAUZE/BANDAGES/DRESSINGS) ×4
DERMABOND ADVANCED .7 DNX12 (GAUZE/BANDAGES/DRESSINGS) ×1 IMPLANT
DRAPE ORTHO SPLIT 77X108 STRL (DRAPES) ×6
DRAPE PROXIMA HALF (DRAPES) ×6 IMPLANT
DRAPE SURG ORHT 6 SPLT 77X108 (DRAPES) ×2 IMPLANT
DRAPE WARM FLUID 44X44 (DRAPE) ×3 IMPLANT
DRSG PAD ABDOMINAL 8X10 ST (GAUZE/BANDAGES/DRESSINGS) ×6 IMPLANT
ELECT CAUTERY BLADE 6.4 (BLADE) ×3 IMPLANT
ELECT REM PT RETURN 9FT ADLT (ELECTROSURGICAL) ×3
ELECTRODE REM PT RTRN 9FT ADLT (ELECTROSURGICAL) ×1 IMPLANT
EVACUATOR SMOKE ACCUVAC VALLEY (FILTER) ×2
GAUZE SPONGE 4X4 12PLY STRL (GAUZE/BANDAGES/DRESSINGS) ×6 IMPLANT
GLOVE BIO SURGEON STRL SZ7.5 (GLOVE) ×3 IMPLANT
GLOVE BIOGEL PI IND STRL 8 (GLOVE) ×1 IMPLANT
GLOVE BIOGEL PI INDICATOR 8 (GLOVE) ×2
GOWN STRL REUS W/ TWL LRG LVL3 (GOWN DISPOSABLE) ×1 IMPLANT
GOWN STRL REUS W/ TWL XL LVL3 (GOWN DISPOSABLE) ×1 IMPLANT
GOWN STRL REUS W/TWL LRG LVL3 (GOWN DISPOSABLE) ×3
GOWN STRL REUS W/TWL XL LVL3 (GOWN DISPOSABLE) ×3
KIT BASIN OR (CUSTOM PROCEDURE TRAY) ×3 IMPLANT
KIT ROOM TURNOVER OR (KITS) ×3 IMPLANT
MARKER SKIN DUAL TIP RULER LAB (MISCELLANEOUS) ×3 IMPLANT
NS IRRIG 1000ML POUR BTL (IV SOLUTION) ×6 IMPLANT
PACK GENERAL/GYN (CUSTOM PROCEDURE TRAY) ×3 IMPLANT
PAD ARMBOARD 7.5X6 YLW CONV (MISCELLANEOUS) ×3 IMPLANT
PREFILTER EVAC NS 1 1/3-3/8IN (MISCELLANEOUS) ×3 IMPLANT
SPONGE LAP 18X18 X RAY DECT (DISPOSABLE) IMPLANT
SUT MNCRL AB 3-0 PS2 18 (SUTURE) ×16 IMPLANT
SUT MNCRL AB 4-0 PS2 18 (SUTURE) ×6 IMPLANT
SUT MON AB 2-0 CT1 36 (SUTURE) ×9 IMPLANT
SUT PROLENE 3 0 PS 2 (SUTURE) ×6 IMPLANT
SUT PROLENE 5 0 PS 2 (SUTURE) ×6 IMPLANT
SUT SILK 4 0 PS 2 (SUTURE) ×12 IMPLANT
SUT VIC AB 3-0 SH 18 (SUTURE) ×3 IMPLANT
TOWEL OR 17X24 6PK STRL BLUE (TOWEL DISPOSABLE) ×3 IMPLANT
TOWEL OR 17X26 10 PK STRL BLUE (TOWEL DISPOSABLE) ×3 IMPLANT
TUBE CONNECTING 12'X1/4 (SUCTIONS) ×1
TUBE CONNECTING 12X1/4 (SUCTIONS) ×2 IMPLANT

## 2015-10-27 NOTE — Anesthesia Preprocedure Evaluation (Signed)
Anesthesia Evaluation  Patient identified by MRN, date of birth, ID band Patient awake    Reviewed: Allergy & Precautions, NPO status , Patient's Chart, lab work & pertinent test results  Airway Mallampati: III  TM Distance: <3 FB Neck ROM: Full    Dental no notable dental hx.    Pulmonary neg pulmonary ROS, former smoker,    Pulmonary exam normal breath sounds clear to auscultation       Cardiovascular negative cardio ROS Normal cardiovascular exam Rhythm:Regular Rate:Normal     Neuro/Psych Seizures -, Well Controlled,  negative psych ROS   GI/Hepatic negative GI ROS, Neg liver ROS,   Endo/Other  Morbid obesity  Renal/GU negative Renal ROS  negative genitourinary   Musculoskeletal negative musculoskeletal ROS (+)   Abdominal   Peds negative pediatric ROS (+)  Hematology negative hematology ROS (+)   Anesthesia Other Findings   Reproductive/Obstetrics negative OB ROS                             Anesthesia Physical Anesthesia Plan  ASA: III  Anesthesia Plan: General   Post-op Pain Management:    Induction: Intravenous  Airway Management Planned: Oral ETT  Additional Equipment:   Intra-op Plan:   Post-operative Plan: Extubation in OR  Informed Consent: I have reviewed the patients History and Physical, chart, labs and discussed the procedure including the risks, benefits and alternatives for the proposed anesthesia with the patient or authorized representative who has indicated his/her understanding and acceptance.   Dental advisory given  Plan Discussed with: CRNA and Surgeon  Anesthesia Plan Comments:         Anesthesia Quick Evaluation

## 2015-10-27 NOTE — H&P (Signed)
I have re-examined and re-evaluated the patient and there are no changes.  See office notes in paper chart for H&P. 

## 2015-10-27 NOTE — Anesthesia Postprocedure Evaluation (Signed)
Anesthesia Post Note  Patient: Katherine Simmons  Procedure(s) Performed: Procedure(s) (LRB): MAMMARY REDUCTION  (BREAST) WITH FREE NIPPLE GRAFTS (Bilateral)  Patient location during evaluation: PACU Anesthesia Type: General Level of consciousness: awake and alert Pain management: pain level controlled Vital Signs Assessment: post-procedure vital signs reviewed and stable Respiratory status: spontaneous breathing, nonlabored ventilation, respiratory function stable and patient connected to nasal cannula oxygen Cardiovascular status: blood pressure returned to baseline and stable Postop Assessment: no signs of nausea or vomiting Anesthetic complications: no    Last Vitals:  Filed Vitals:   10/27/15 1116 10/27/15 1130  BP: 150/49 145/103  Pulse: 113 120  Temp: 36.7 C   Resp: 17 23    Last Pain:  Filed Vitals:   10/27/15 1146  PainSc: 7                  Aleiyah Halpin S

## 2015-10-27 NOTE — Transfer of Care (Signed)
Immediate Anesthesia Transfer of Care Note  Patient: Randon GoldsmithKendrale R Ruhe  Procedure(s) Performed: Procedure(s): MAMMARY REDUCTION  (BREAST) WITH FREE NIPPLE GRAFTS (Bilateral)  Patient Location: PACU  Anesthesia Type:General  Level of Consciousness: awake, alert  and oriented  Airway & Oxygen Therapy: Patient Spontanous Breathing and Patient connected to face mask oxygen  Post-op Assessment: Report given to RN, Post -op Vital signs reviewed and stable and Patient moving all extremities X 4  Post vital signs: Reviewed and stable  Last Vitals:  Filed Vitals:   10/27/15 0624 10/27/15 1116  BP: 120/62 150/49  Pulse: 103 113  Temp: 36.7 C 36.7 C  Resp: 20 17    Last Pain: There were no vitals filed for this visit.    Patients Stated Pain Goal: 6 (10/27/15 0705)  Complications: No apparent anesthesia complications

## 2015-10-27 NOTE — Brief Op Note (Signed)
10/27/2015  11:36 AM  PATIENT:  Katherine Simmons  30 y.o. female  PRE-OPERATIVE DIAGNOSIS:  BILATERAL BREAST HYPERTROPHY  POST-OPERATIVE DIAGNOSIS:  same  PROCEDURE:  Procedure(s): MAMMARY REDUCTION  (BREAST) WITH FREE NIPPLE GRAFTS (Bilateral)  SURGEON:  Surgeon(s) and Role:    * Etter Sjogrenavid Vanette Noguchi, MD - Primary  PHYSICIAN ASSISTANT:   ASSISTANTS: Myrtie SomanSharon Hitchcock, RNFA   ANESTHESIA:   general  EBL:  Total I/O In: 1000 [I.V.:1000] Out: 290 [Urine:250; Blood:40]  BLOOD ADMINISTERED:none  DRAINS: None   LOCAL MEDICATIONS USED:  NONE  SPECIMEN:  Source of Specimen:  bilateral breasts  DISPOSITION OF SPECIMEN:  PATHOLOGY  COUNTS:  YES  TOURNIQUET:  * No tourniquets in log *  DICTATION: .Other Dictation: Dictation Number [id number]  PLAN OF CARE: Admit for overnight observation  PATIENT DISPOSITION:  PACU - hemodynamically stable.   Delay start of Pharmacological VTE agent (>24hrs) due to surgical blood loss or risk of bleeding: no

## 2015-10-27 NOTE — Anesthesia Procedure Notes (Signed)
Procedure Name: Intubation Date/Time: 10/27/2015 7:44 AM Performed by: Little IshikawaMERCER, Katherine Simmons: Patient identified, Timeout performed, Emergency Drugs available, Suction available and Patient being monitored Patient Re-evaluated:Patient Re-evaluated prior to inductionOxygen Delivery Method: Circle system utilized Preoxygenation: Pre-oxygenation with 100% oxygen Intubation Type: IV induction Ventilation: Mask ventilation without difficulty Laryngoscope Size: Mac and 3 Grade View: Grade I Tube type: Oral Tube size: 7.0 mm Number of attempts: 1 Airway Equipment and Method: Stylet Placement Confirmation: ETT inserted through vocal cords under direct vision,  positive ETCO2 and breath sounds checked- equal and bilateral Secured at: 22 cm Tube secured with: Tape Dental Injury: Teeth and Oropharynx as per pre-operative assessment

## 2015-10-27 NOTE — Op Note (Signed)
NAMEMarland Simmons  [name], [name] NO.:  [id number]  MEDICAL RECORD NO.:  [id number]  LOCATION:  [facility]                         FACILITY:  [facility]  PHYSICIAN:  Etter Sjogren, M.D.     DATE OF BIRTH:  10-29-1985  DATE OF PROCEDURE:  10/27/2015 DATE OF DISCHARGE:                              OPERATIVE REPORT   PREOPERATIVE DIAGNOSES:  Bilateral macromastia with upper back pain, neck pain and shoulder pain.  POSTOPERATIVE DIAGNOSES:  Bilateral macromastia with upper back pain, neck pain and shoulder pain.  PROCEDURE PERFORMED:  Bilateral breast reduction with free nipple graft technique.  SURGEON:  Etter Sjogren, M.D.  ASSISTANT:  Myrtie Soman, RNFA.  ANESTHESIA:  General.  CLINICAL NOTE:  A 30 year old woman, complains of extremely large breast with upper back pain, neck pain, and shoulder pain.  She desired breast reduction.  It was explained to her that due to the size and the length of her breast that a free nipple graft would be utilized.  She agreed with this.  She understood that with this particular technique that she would very likely lose some of the color and the nipple-areolar complexes, that she would lose the feeling in the nipple-areolar complexes, and that she would not be able to breastfeed.  Other risks and possible complications were discussed with her in detail and these included, but were not limited to, bleeding, infection, healing problems, scarring, loss of sensation, fluid accumulations, anesthesia- related complications, asymmetry, chronic pain, failure to relieve symptoms, loss of tissue, loss of the nipple grafts, loss of skin, loss of fatty tissue, asymmetry and contour deformities as well as overall disappointment.  She understood all of these and wished to proceed.  DESCRIPTION OF PROCEDURE:  The patient was marked in a full standing position in the holding area.  She was then taken to the operating room and placed supine.  She was prepped  with ChloraPrep after successful induction of general anesthesia.  She was then draped with sterile drapes after waiting 3 minutes for drying of the ChloraPrep.  The 38-mm marked the nipple-areolar complex and these were harvested and labeled left and right.  The central area was de-epithelialized and all incisions made and then the dissection was begun inferiorly and continued cephalad.  The lower pole of the breast was then removed.  The totals were 2389 g from the smaller left breast and 3154 g from the larger right breast.  This seemed to give her a good symmetry in terms of volume.  A thorough irrigation with saline and meticulous hemostasis with electrocautery.  The closures with 2-0 and 3-0 Monocryl, interrupted inverted deep dermal sutures and running 3-0 Monocryl subcuticular suture.  A measurement was then taken 5 cm up from the inframammary crease and the 38-mm marker marked the site for the nipple- areolar complexes.  This was then de-epithelialized very carefully.  The nipple grafts were then prepared, defatting meticulously.  The nipple grafts were applied and 4-0 silk sutures were placed at all of the eight quadrants and one tail was left long on the silks for the tie-over bolster.  The grafts were flushed underneath to irrigate the wound bed and then 5-0 Prolene simple running  suture was run around the periphery. Saline soaked cotton balls and Xeroform gauze were then placed over the nipples as a bolster and the 4-0 silk sutures were tied securely over these. Dermabond was applied on the wounds.  Dry sterile dressing, circumferential Ace wrap and the breast vest were applied and she was transferred to the recovery room stable having tolerated the procedure well.  DISPOSITION:  She will be observed overnight.     Etter Sjogrenavid Clark Clowdus, M.D.     DB/MEDQ  D:  10/27/2015  T:  10/27/2015  Job:  [id number]

## 2015-10-28 ENCOUNTER — Encounter (HOSPITAL_COMMUNITY): Payer: Self-pay | Admitting: Plastic Surgery

## 2015-10-28 DIAGNOSIS — N62 Hypertrophy of breast: Secondary | ICD-10-CM | POA: Diagnosis not present

## 2015-10-28 NOTE — Discharge Instructions (Addendum)
No lifting for 6 weeks No vigorous activity for 6 weeks (including outdoor walks) No driving for 4 weeks OK to walk up stairs slowly Stay propped up Use incentive spirometer at home every hour while awake No shower  Take an over-the-counter Probiotic while on antibiotics Take an over-the-counter stool softener (such as Colace) while on pain medication Continue the Lovenox injections at home tomorrow morning See Dr. Odis LusterBowers in office nest week For questions call [phone] or [phone] (after hours)

## 2015-10-28 NOTE — Discharge Summary (Signed)
Physician Discharge Summary  Patient ID: Katherine GoldsmithKendrale R Simmons MRN: [id number] DOB/AGE: 1985/10/01 30 y.o.  Admit date: 10/27/2015 Discharge date: 10/28/2015  Admission Diagnoses: Breast hypertrophy  Discharge Diagnoses:  Same Active Problems:   Breast hypertrophy in female   Discharged Condition: good  Hospital Course: On the day of admission the patient was taken to surgery and had bilateral breast reduction with free nipple grafts. The patient tolerated the procedures well. Postoperatively, the chest was soft and dressing intact. The patient was ambulatory and tolerating diet on the first postoperative day. She is ready for discharge.  Treatments: antibiotics: Ancef, anticoagulation: LMW heparin and surgery: bilateral breast reduction with free nipple grafts  Discharge Exam: Blood pressure 140/74, pulse 113, temperature 97.6 F (36.4 C), temperature source Oral, resp. rate 19, height 5\' 5"  (1.651 m), weight 338 lb (153.316 kg), last menstrual period 10/16/2015, SpO2 98 %.  Operative sites: Chest soft. No evidence of bleeding.  Disposition: 01-Home or Self Care     Medication List    TAKE these medications        doxycycline 100 MG tablet  Commonly known as:  VIBRA-TABS  Take 100 mg by mouth 2 (two) times daily.     enoxaparin 40 MG/0.4ML injection  Commonly known as:  LOVENOX  Inject 40 mg into the skin daily.     HYDROmorphone 2 MG tablet  Commonly known as:  DILAUDID  Take 2 mg by mouth every 4 (four) hours as needed for moderate pain.     levETIRAcetam 500 MG 24 hr tablet  Commonly known as:  KEPPRA XR  Take 1,000 mg by mouth daily.     methocarbamol 500 MG tablet  Commonly known as:  ROBAXIN  Take 500 mg by mouth every 6 (six) hours as needed for muscle spasms.         SignedOdis Luster: Latanga Nedrow M 10/28/2015, 8:28 AM

## 2015-10-28 NOTE — Progress Notes (Signed)
Discharge papers gone over with pt. IV taken out. Per MD pt. Already has prescriptions. Taught pt. How to do lovenox injections. No Questions/complaints. Pt. D/c'd successfully via w/c.

## 2016-09-26 ENCOUNTER — Other Ambulatory Visit (HOSPITAL_COMMUNITY)
Admission: RE | Admit: 2016-09-26 | Discharge: 2016-09-26 | Disposition: A | Discharge status: Home / Self Care | Payer: BLUE CROSS/BLUE SHIELD | Source: Ambulatory Visit | Attending: Obstetrics & Gynecology | Admitting: Obstetrics & Gynecology

## 2016-09-26 ENCOUNTER — Encounter: Payer: Self-pay | Admitting: Obstetrics & Gynecology

## 2016-09-26 ENCOUNTER — Ambulatory Visit (INDEPENDENT_AMBULATORY_CARE_PROVIDER_SITE_OTHER): Payer: BLUE CROSS/BLUE SHIELD | Admitting: Obstetrics & Gynecology

## 2016-09-26 VITALS — BP 132/84 | HR 88 | Ht 66.0 in | Wt 332.0 lb

## 2016-09-26 DIAGNOSIS — Z01419 Encounter for gynecological examination (general) (routine) without abnormal findings: Secondary | ICD-10-CM | POA: Insufficient documentation

## 2016-09-26 NOTE — Progress Notes (Signed)
Subjective:     Katherine Simmons is a 31 y.o. female here for a routine exam.  Patient's last menstrual period was 09/16/2016 (exact date). [id number] Birth Control Method:  Both tubes removed Menstrual Calendar(currently): regular  Current complaints: none.   Current acute medical issues:   Recent Gynecologic History Patient's last menstrual period was 09/16/2016 (exact date). Last Pap: 2017,  normal Last mammogram: n/a,    Past Medical History:  Diagnosis Date  . Breast hypertrophy   . Ectopic pregnancy   . Epilepsy, grand mal (HCC) last 01/2013  . JWJXBJYN(829.5)    "monthly" (10/27/2015)  . Infection    UTI  . Migraine    "a few/year" (10/27/2015)  . Scabies     Past Surgical History:  Procedure Laterality Date  . BREAST REDUCTION SURGERY Bilateral 10/27/2015   Procedure: MAMMARY REDUCTION  (BREAST) WITH FREE NIPPLE GRAFTS;  Surgeon: Etter Sjogren, MD;  Location: [facility] OR;  Service: Plastics;  Laterality: Bilateral;  . LAPAROSCOPIC UNILATERAL SALPINGECTOMY Right 2013   For ectopic pregnancy  . LAPAROSCOPIC UNILATERAL SALPINGECTOMY Left 03/19/2013   Procedure: LAPAROSCOPIC LEFT SALPINGECTOMY;  Surgeon: Tilda Burrow, MD;  Location: [facility];  Service: Gynecology;  Laterality: Left;  . REDUCTION MAMMAPLASTY  10/27/2015   with free nipple graft technique  . THERAPEUTIC ABORTION  ~ 2002    OB History    Gravida Para Term Preterm AB Living   0 2 1   SAB TAB Ectopic Multiple Live Births     1 1 0        Obstetric Comments   O POSITIVE per old records      Social History   Social History  . Marital status: Married    Spouse name: N/A  . Number of children: N/A  . Years of education: N/A   Social History Main Topics  . Smoking status: Current Every Day Smoker    Packs/day: 0.50    Years: 16.00    Types: Cigarettes    Last attempt to quit: 09/19/2015  . Smokeless tobacco: Never Used  . Alcohol use 0.5 oz/week    1 Standard drinks or equivalent per week   Comment: 10/27/2015 "I'll have a beer q couple weeks"  . Drug use: No  . Sexual activity: Yes    Birth control/ protection: None   Other Topics Concern  . None   Social History Narrative  . None    Family History  Problem Relation Age of Onset  . Asthma Sister   . Hypertension Maternal Aunt   . Cancer Maternal Grandmother     lung  . Hearing loss Neg Hx      Current Outpatient Prescriptions:  .  ibuprofen (ADVIL,MOTRIN) 800 MG tablet, Take 800 mg by mouth every 8 (eight) hours as needed., Disp: , Rfl:  .  levETIRAcetam (KEPPRA XR) 500 MG 24 hr tablet, Take 1,000 mg by mouth daily., Disp: , Rfl: 1  Review of Systems  Review of Systems  Constitutional: Negative for fever, chills, weight loss, malaise/fatigue and diaphoresis.  HENT: Negative for hearing loss, ear pain, nosebleeds, congestion, sore throat, neck pain, tinnitus and ear discharge.   Eyes: Negative for blurred vision, double vision, photophobia, pain, discharge and redness.  Respiratory: Negative for cough, hemoptysis, sputum production, shortness of breath, wheezing and stridor.   Cardiovascular: Negative for chest pain, palpitations, orthopnea, claudication, leg swelling and PND.  Gastrointestinal: negative for abdominal pain. Negative for heartburn, nausea, vomiting, diarrhea, constipation, blood  in stool and melena.  Genitourinary: Negative for dysuria, urgency, frequency, hematuria and flank pain.  Musculoskeletal: Negative for myalgias, back pain, joint pain and falls.  Skin: Negative for itching and rash.  Neurological: Negative for dizziness, tingling, tremors, sensory change, speech change, focal weakness, seizures, loss of consciousness, weakness and headaches.  Endo/Heme/Allergies: Negative for environmental allergies and polydipsia. Does not bruise/bleed easily.  Psychiatric/Behavioral: Negative for depression, suicidal ideas, hallucinations, memory loss and substance abuse. The patient is not  nervous/anxious and does not have insomnia.        Objective:  Blood pressure 132/84, pulse 88, height  (1.676 m), weight (!) 332 lb (150.6 kg), last menstrual period 09/16/2016.   Physical Exam  Vitals reviewed. Constitutional: She is oriented to person, place, and time. She appears well-developed and well-nourished.  HENT:  Head: Normocephalic and atraumatic.        Right Ear: External ear normal.  Left Ear: External ear normal.  Nose: Nose normal.  Mouth/Throat: Oropharynx is clear and moist.  Eyes: Conjunctivae and EOM are normal. Pupils are equal, round, and reactive to light. Right eye exhibits no discharge. Left eye exhibits no discharge. No scleral icterus.  Neck: Normal range of motion. Neck supple. No tracheal deviation present. No thyromegaly present.  Cardiovascular: Normal rate, regular rhythm, normal heart sounds and intact distal pulses.  Exam reveals no gallop and no friction rub.   No murmur heard. Respiratory: Effort normal and breath sounds normal. No respiratory distress. She has no wheezes. She has no rales. She exhibits no tenderness.  GI: Soft. Bowel sounds are normal. She exhibits no distension and no mass. There is no tenderness. There is no rebound and no guarding.  Genitourinary:  Breasts no masses skin changes or nipple changes bilaterally      Vulva is normal without lesions Vagina is pink moist without discharge Cervix normal in appearance and pap is done Uterus is normal size shape and contour Adnexa is negative with normal sized ovaries   Musculoskeletal: Normal range of motion. She exhibits no edema and no tenderness.  Neurological: She is alert and oriented to person, place, and time. She has normal reflexes. She displays normal reflexes. No cranial nerve deficit. She exhibits normal muscle tone. Coordination normal.  Skin: Skin is warm and dry. No rash noted. No erythema. No pallor.  Psychiatric: She has a normal mood and affect. Her behavior is  normal. Judgment and thought content normal.       Medications Ordered at today's visit: Meds ordered this encounter  Medications  . ibuprofen (ADVIL,MOTRIN) 800 MG tablet    Sig: Take 800 mg by mouth every 8 (eight) hours as needed.    Other orders placed at today's visit: No orders of the defined types were placed in this encounter.     Assessment:    Healthy female exam.    Plan:    Contraception: tubal ligation. Follow up in: 1 year.     Return in about 1 year (around 09/26/2017) for yearly, with Dr Despina Hidden.

## 2016-09-27 LAB — CYTOLOGY - PAP
CHLAMYDIA, DNA PROBE: NEGATIVE
Diagnosis: NEGATIVE
NEISSERIA GONORRHEA: NEGATIVE

## 2017-09-29 ENCOUNTER — Other Ambulatory Visit (HOSPITAL_COMMUNITY)
Admission: RE | Admit: 2017-09-29 | Discharge: 2017-09-29 | Disposition: A | Discharge status: Home / Self Care | Payer: BLUE CROSS/BLUE SHIELD | Source: Ambulatory Visit | Attending: Obstetrics & Gynecology | Admitting: Obstetrics & Gynecology

## 2017-09-29 ENCOUNTER — Encounter: Payer: Self-pay | Admitting: Obstetrics & Gynecology

## 2017-09-29 ENCOUNTER — Encounter (INDEPENDENT_AMBULATORY_CARE_PROVIDER_SITE_OTHER): Payer: Self-pay

## 2017-09-29 ENCOUNTER — Ambulatory Visit (INDEPENDENT_AMBULATORY_CARE_PROVIDER_SITE_OTHER): Payer: BLUE CROSS/BLUE SHIELD | Admitting: Obstetrics & Gynecology

## 2017-09-29 VITALS — BP 138/70 | HR 109 | Ht 65.0 in | Wt 338.0 lb

## 2017-09-29 DIAGNOSIS — Z01419 Encounter for gynecological examination (general) (routine) without abnormal findings: Secondary | ICD-10-CM

## 2017-09-29 DIAGNOSIS — E8881 Metabolic syndrome: Secondary | ICD-10-CM

## 2017-09-29 NOTE — Progress Notes (Signed)
Subjective:     Katherine Simmons is a 32 y.o. female here for a routine exam.  Patient's last menstrual period was 09/18/2017. [id number] Birth Control Method:  No tubes Menstrual Calendar(currently): regular  Current complaints: none.   Current acute medical issues:     Recent Gynecologic History Patient's last menstrual period was 09/18/2017. Last Pap: 2018,  normal Last mammogram: ,    Past Medical History:  Diagnosis Date  . Breast hypertrophy   . Ectopic pregnancy   . Epilepsy, grand mal (HCC) last 01/2013  . AVWUJWJX(914.7)    "monthly" (10/27/2015)  . Infection    UTI  . Migraine    "a few/year" (10/27/2015)  . Scabies     Past Surgical History:  Procedure Laterality Date  . BREAST REDUCTION SURGERY Bilateral 10/27/2015   Procedure: MAMMARY REDUCTION  (BREAST) WITH FREE NIPPLE GRAFTS;  Surgeon: Etter Sjogren, MD;  Location: [facility] OR;  Service: Plastics;  Laterality: Bilateral;  . LAPAROSCOPIC UNILATERAL SALPINGECTOMY Right 2013   For ectopic pregnancy  . LAPAROSCOPIC UNILATERAL SALPINGECTOMY Left 03/19/2013   Procedure: LAPAROSCOPIC LEFT SALPINGECTOMY;  Surgeon: Tilda Burrow, MD;  Location: [facility];  Service: Gynecology;  Laterality: Left;  . REDUCTION MAMMAPLASTY  10/27/2015   with free nipple graft technique  . THERAPEUTIC ABORTION  ~ 2002    OB History    Gravida  4   Para  1   Term  1   Preterm  0   AB  2   Living  1     SAB      TAB  1   Ectopic  1   Multiple  0   Live Births  1        Obstetric Comments  O POSITIVE per old records        Social History   Socioeconomic History  . Marital status: Married    Spouse name: Not on file  . Number of children: Not on file  . Years of education: Not on file  . Highest education level: Not on file  Occupational History  . Not on file  Social Needs  . Financial resource strain: Not on file  . Food insecurity:    Worry: Not on file    Inability: Not on file  . Transportation needs:   Medical: Not on file    Non-medical: Not on file  Tobacco Use  . Smoking status: Current Every Day Smoker    Packs/day: 0.50    Years: 16.00    Pack years: 8.00    Types: Cigarettes  . Smokeless tobacco: Never Used  Substance and Sexual Activity  . Alcohol use: Yes    Alcohol/week: 1.0 standard drinks    Types: 1 Standard drinks or equivalent per week    Comment: occ  . Drug use: No  . Sexual activity: Yes    Birth control/protection: None  Lifestyle  . Physical activity:    Days per week: Not on file    Minutes per session: Not on file  . Stress: Not on file  Relationships  . Social connections:    Talks on phone: Not on file    Gets together: Not on file    Attends religious service: Not on file    Active member of club or organization: Not on file    Attends meetings of clubs or organizations: Not on file    Relationship status: Not on file  Other Topics Concern  . Not on file  Social History Narrative  . Not on file    Family History  Problem Relation Age of Onset  . Asthma Sister   . Hypertension Maternal Aunt   . Cancer Maternal Grandmother        lung  . Hearing loss Neg Hx      Current Outpatient Medications:  .  ibuprofen (ADVIL,MOTRIN) 800 MG tablet, Take 800 mg by mouth every 8 (eight) hours as needed., Disp: , Rfl:  .  levETIRAcetam (KEPPRA XR) 500 MG 24 hr tablet, Take 1,000 mg by mouth daily., Disp: , Rfl: 1  Review of Systems  Review of Systems  Constitutional: Negative for fever, chills, weight loss, malaise/fatigue and diaphoresis.  HENT: Negative for hearing loss, ear pain, nosebleeds, congestion, sore throat, neck pain, tinnitus and ear discharge.   Eyes: Negative for blurred vision, double vision, photophobia, pain, discharge and redness.  Respiratory: Negative for cough, hemoptysis, sputum production, shortness of breath, wheezing and stridor.   Cardiovascular: Negative for chest pain, palpitations, orthopnea, claudication, leg swelling  and PND.  Gastrointestinal: negative for abdominal pain. Negative for heartburn, nausea, vomiting, diarrhea, constipation, blood in stool and melena.  Genitourinary: Negative for dysuria, urgency, frequency, hematuria and flank pain.  Musculoskeletal: Negative for myalgias, back pain, joint pain and falls.  Skin: Negative for itching and rash.  Neurological: Negative for dizziness, tingling, tremors, sensory change, speech change, focal weakness, seizures, loss of consciousness, weakness and headaches.  Endo/Heme/Allergies: Negative for environmental allergies and polydipsia. Does not bruise/bleed easily.  Psychiatric/Behavioral: Negative for depression, suicidal ideas, hallucinations, memory loss and substance abuse. The patient is not nervous/anxious and does not have insomnia.        Objective:  Blood pressure 138/70, pulse (!) 109, height 5\' 5"  (1.651 m), weight (!) 338 lb (153.3 kg), last menstrual period 09/18/2017.   Physical Exam  Vitals reviewed. Constitutional: She is oriented to person, place, and time. She appears well-developed and well-nourished.  HENT:  Head: Normocephalic and atraumatic.        Right Ear: External ear normal.  Left Ear: External ear normal.  Nose: Nose normal.  Mouth/Throat: Oropharynx is clear and moist.  Eyes: Conjunctivae and EOM are normal. Pupils are equal, round, and reactive to light. Right eye exhibits no discharge. Left eye exhibits no discharge. No scleral icterus.  Neck: Normal range of motion. Neck supple. No tracheal deviation present. No thyromegaly present.  Cardiovascular: Normal rate, regular rhythm, normal heart sounds and intact distal pulses.  Exam reveals no gallop and no friction rub.   No murmur heard. Respiratory: Effort normal and breath sounds normal. No respiratory distress. She has no wheezes. She has no rales. She exhibits no tenderness.  GI: Soft. Bowel sounds are normal. She exhibits no distension and no mass. There is no  tenderness. There is no rebound and no guarding.  Genitourinary:  Breasts no masses skin changes or nipple changes bilaterally      Vulva is normal without lesions Vagina is pink moist without discharge Cervix normal in appearance and pap is done Uterus is normal size shape and contour Adnexa is negative with normal sized ovaries   Musculoskeletal: Normal range of motion. She exhibits no edema and no tenderness.  Neurological: She is alert and oriented to person, place, and time. She has normal reflexes. She displays normal reflexes. No cranial nerve deficit. She exhibits normal muscle tone. Coordination normal.  Skin: Skin is warm and dry. No rash noted. No erythema. No pallor.  Psychiatric: She has  a normal mood and affect. Her behavior is normal. Judgment and thought content normal.       Medications Ordered at today's visit: No orders of the defined types were placed in this encounter.   Other orders placed at today's visit: Orders Placed This Encounter  Procedures  . Hemoglobin A1C  . Lipid Profile  . Triglycerides      Assessment:    Healthy female exam.    Plan:         Return in about 1 year (around 09/30/2018) for yearly, with Dr Despina Hidden.

## 2017-09-30 LAB — LIPID PANEL
CHOLESTEROL TOTAL: 194 mg/dL (ref 100–199)
Chol/HDL Ratio: 4.4 ratio (ref 0.0–4.4)
HDL: 44 mg/dL (ref >39)
LDL CALC: 124 mg/dL — AB (ref 0–99)
Triglycerides: 131 mg/dL (ref 0–149)
VLDL Cholesterol Cal: 26 mg/dL (ref 5–40)

## 2017-09-30 LAB — HEMOGLOBIN A1C
ESTIMATED AVERAGE GLUCOSE: 131 mg/dL
HEMOGLOBIN A1C: 6.2 % — AB (ref 4.8–5.6)

## 2017-10-03 LAB — CYTOLOGY - PAP
DIAGNOSIS: NEGATIVE
HPV: NOT DETECTED

## 2018-07-05 ENCOUNTER — Telehealth: Payer: Self-pay | Admitting: *Deleted

## 2018-07-05 ENCOUNTER — Other Ambulatory Visit (INDEPENDENT_AMBULATORY_CARE_PROVIDER_SITE_OTHER): Payer: BLUE CROSS/BLUE SHIELD

## 2018-07-05 DIAGNOSIS — R102 Pelvic and perineal pain unspecified side: Secondary | ICD-10-CM

## 2018-07-05 DIAGNOSIS — R350 Frequency of micturition: Secondary | ICD-10-CM

## 2018-07-05 DIAGNOSIS — R300 Dysuria: Secondary | ICD-10-CM | POA: Diagnosis not present

## 2018-07-05 LAB — POCT URINALYSIS DIPSTICK OB
Glucose, UA: NEGATIVE
Ketones, UA: NEGATIVE
Leukocytes, UA: NEGATIVE
Nitrite, UA: NEGATIVE
POC,PROTEIN,UA: NEGATIVE

## 2018-07-05 NOTE — Telephone Encounter (Signed)
Pt called stating that she thinks she has a uti. She c/o burning, pressure, and frequency. Advised, per Victorino Dike, she could come by the office and leave [place] a urine sample. Pt states that she is working today so she will have to come by tomorrow.

## 2018-07-05 NOTE — Progress Notes (Signed)
Pt c/o burning with urination, pressure, and urinary frequency. Urine dip only shows trace of blood. Advised that we would send urine for culture and treat if it shows something. Pt verbalized understanding.

## 2018-07-06 ENCOUNTER — Telehealth: Payer: Self-pay | Admitting: Obstetrics & Gynecology

## 2018-07-06 ENCOUNTER — Ambulatory Visit: Payer: BLUE CROSS/BLUE SHIELD

## 2018-07-06 LAB — MICROSCOPIC EXAMINATION
Casts: NONE SEEN /lpf
Epithelial Cells (non renal): >10 /hpf — AB (ref 0–10)

## 2018-07-06 LAB — URINALYSIS, ROUTINE W REFLEX MICROSCOPIC
Bilirubin, UA: NEGATIVE
Glucose, UA: NEGATIVE
KETONES UA: NEGATIVE
Nitrite, UA: NEGATIVE
Protein, UA: NEGATIVE
Specific Gravity, UA: 1.018 (ref 1.005–1.030)
Urobilinogen, Ur: 0.2 mg/dL (ref 0.2–1.0)
pH, UA: 5 (ref 5.0–7.5)

## 2018-07-07 LAB — URINE CULTURE

## 2018-07-09 ENCOUNTER — Telehealth: Payer: Self-pay | Admitting: *Deleted

## 2018-08-08 NOTE — Telephone Encounter (Signed)
Note sent to nurse. 

## 2018-10-02 ENCOUNTER — Other Ambulatory Visit: Payer: Self-pay | Admitting: Obstetrics & Gynecology

## 2022-11-14 ENCOUNTER — Encounter: Payer: Self-pay | Admitting: Gastroenterology

## 2022-11-14 ENCOUNTER — Ambulatory Visit: Payer: BC Managed Care – PPO | Admitting: Gastroenterology

## 2022-11-14 VITALS — BP 109/77 | HR 83 | Temp 98.1°F | Ht 65.0 in | Wt 267.2 lb

## 2022-11-14 DIAGNOSIS — K5900 Constipation, unspecified: Secondary | ICD-10-CM | POA: Diagnosis not present

## 2022-11-14 DIAGNOSIS — R140 Abdominal distension (gaseous): Secondary | ICD-10-CM | POA: Diagnosis not present

## 2022-11-14 NOTE — Patient Instructions (Addendum)
Please start taking MiraLAX 17 g (1 capful) nightly in 8 ounces of water. I would also like for you to start taking a fiber supplement such as Benefiber, 2-3 teaspoons daily in 8 ounces of water.  You may mix this with your MiraLAX at night or take in the morning, what ever your preference is.  It is not uncommon that when you increase fiber in your diet that you may have a transient increase in gassiness, this is normal.  Avoid gas-producing foods (eg, garlic, cabbage, legumes, onions, broccoli, brussel sprouts, cauliflower, wheat, and potatoes).   You may use Gas-X, Phazyme, or Beano as needed for bloating.  As we discussed Phazyme contains a higher amount of simethicone than Gas-X or Beano and this may provide you with some better relief.  If you decide that she would like to perform some lab work or do the SIBO testing please let me know.  If we do lab work I will get a general lab panel as well as testing for celiac given your bloating.  We will plan to follow-up in 3 months, sooner if needed.  Please not hesitate to reach out via MyChart if you have any questions or if you decide MiraLAX is not working and you would like to try prescription management.  It was a pleasure to see you today. I want to create trusting relationships with patients. If you receive a survey regarding your visit,  I greatly appreciate you taking time to fill this out on paper or through your MyChart. I value your feedback.  Brooke Bonito, MSN, FNP-BC, AGACNP-BC [facility]

## 2022-11-14 NOTE — Progress Notes (Signed)
GI Office Note    Referring Provider: Medicine, [facility] Primary Care Physician:  Health, [facility]  Primary Gastroenterologist: Gerrit Friends.Rourk, MD  Chief Complaint   Chief Complaint  Patient presents with   Constipation    Having some constipation and bloating. Stomach feels hard at the top.    History of Present Illness   Katherine Simmons is a 37 y.o. female presenting today at the request of Medicine, [facility] for constipation.  Labs June 2023: Hemoglobin 13, platelets 310 creatinine 0.94, sodium 136, potassium 4.2.  CT A/P 11/12/2021: -No acute abnormality. -Moderate amount of retained fecal material in the colon  Goes to the bathroom everyday bit spends long amounts of time on the toilet (40 min sometimes longer). Only time ist is not like hat is if she takes miralax. Seen her PCP last year regarding that and was told to take miralax. She wants to know the root cause of it. Does have bloating that occurs at times and feels like she is pregnant. Sometimes she states the bloating comes down but does not every go completely down. She states her upper abdomen feels hard at times. Does pass a lot of gas. She reports sometimes her gas is very potent. Here lately she has ut back on broccoli and those types of vegetables. She was substituting broccoli and brussels sprouts for other foods. Does not eat fried foods. Still follows her previous lifestyle change and still eats a lot of vegetables. Has been working on her protein intake. Drinks only water and does exercise daily. Peeing frequently as well. Sometimes she feels like she has to force herself to pee. Does miralax here and there - does not want to become dependent on it. When it gets into her system she has a large amount. She took a colon cleanse and reports she had a foul smelling BM and felt better afterwards. After she took it a few times she states she was not able to go.   Has been on phentermine for  about a month.   States she has lost from 375 lbs a couple years ago.   No family history of colon cancer or colon polyps.   Current Outpatient Medications  Medication Sig Dispense Refill   ibuprofen (ADVIL,MOTRIN) 800 MG tablet Take 800 mg by mouth every 8 (eight) hours as needed.     levETIRAcetam (KEPPRA XR) 500 MG 24 hr tablet Take 1,000 mg by mouth daily.  1   phentermine (ADIPEX-P) 37.5 MG tablet Take 37.5 mg by mouth every morning.     No current facility-administered medications for this visit.    Past Medical History:  Diagnosis Date   Breast hypertrophy    Ectopic pregnancy    Epilepsy, grand mal (HCC) last 01/2013   Headache(784.0)    "monthly" (10/27/2015)   Infection    UTI   Migraine    "a few/year" (10/27/2015)   Scabies     Past Surgical History:  Procedure Laterality Date   BREAST REDUCTION SURGERY Bilateral 10/27/2015   Procedure: MAMMARY REDUCTION  (BREAST) WITH FREE NIPPLE GRAFTS;  Surgeon: Etter Sjogren, MD;  Location: [facility] OR;  Service: Plastics;  Laterality: Bilateral;   LAPAROSCOPIC UNILATERAL SALPINGECTOMY Right 2013   For ectopic pregnancy   LAPAROSCOPIC UNILATERAL SALPINGECTOMY Left 03/19/2013   Procedure: LAPAROSCOPIC LEFT SALPINGECTOMY;  Surgeon: Tilda Burrow, MD;  Location: [facility] ORS;  Service: Gynecology;  Laterality: Left;   REDUCTION MAMMAPLASTY  10/27/2015   with free  nipple graft technique   THERAPEUTIC ABORTION  ~ 2002    Family History  Problem Relation Age of Onset   Asthma Sister    Hypertension Maternal Aunt    Cancer Maternal Grandmother        lung   Hearing loss Neg Hx     Allergies as of 11/14/2022   (No Known Allergies)    Social History   Socioeconomic History   Marital status: Married    Spouse name: Not on file   Number of children: Not on file   Years of education: Not on file   Highest education level: Not on file  Occupational History   Not on file  Tobacco Use   Smoking status: Every Day    Packs/day: 0.50     Years: 16.00    Additional pack years: 0.00    Total pack years: 8.00    Types: Cigarettes   Smokeless tobacco: Never  Substance and Sexual Activity   Alcohol use: Yes    Alcohol/week: 1.0 standard drink of alcohol    Types: 1 Standard drinks or equivalent per week    Comment: occ   Drug use: No   Sexual activity: Yes    Birth control/protection: None  Other Topics Concern   Not on file  Social History Narrative   Not on file   Social Determinants of Health   Financial Resource Strain: Not on file  Food Insecurity: Not on file  Transportation Needs: Not on file  Physical Activity: Not on file  Stress: Not on file  Social Connections: Not on file  Intimate Partner Violence: Not on file   Review of Systems   Gen: Denies any fever, chills, fatigue, weight loss, lack of appetite.  CV: Denies chest pain, heart palpitations, peripheral edema, syncope.  Resp: Denies shortness of breath at rest or with exertion. Denies wheezing or cough.  GI: see HPI GU : Denies urinary burning, urinary frequency, urinary hesitancy MS: Denies joint pain, muscle weakness, cramps, or limitation of movement.  Derm: Denies rash, itching, dry skin Psych: Denies depression, anxiety, memory loss, and confusion Heme: Denies bruising, bleeding, and enlarged lymph nodes.  Physical Exam   BP 109/77 (BP Location: Right Arm, Patient Position: Sitting, Cuff Size: Large)   Pulse 83   Temp 98.1 F (36.7 C) (Temporal)   Ht 5\' 5"  (1.651 m)   Wt 267 lb 3.2 oz (121.2 kg)   LMP 10/10/2022 (Approximate)   SpO2 99%   BMI 44.46 kg/m   General:   Alert and oriented. Pleasant and cooperative. Well-nourished and well-developed.  Head:  Normocephalic and atraumatic. Eyes:  Without icterus, sclera clear and conjunctiva pink.  Ears:  Normal auditory acuity. Mouth:  No deformity or lesions, oral mucosa pink.  Lungs:  Clear to auscultation bilaterally. No wheezes, rales, or rhonchi. No distress.  Heart:  S1,  S2 present without murmurs appreciated.  Abdomen:  +BS, soft, non-tender and non-distended. No HSM noted. No guarding or rebound. No masses appreciated.  Rectal:  Deferred  Msk:  Symmetrical without gross deformities. Normal posture. Extremities:  Without edema. Neurologic:  Alert and  oriented x4;  grossly normal neurologically. Skin:  Intact without significant lesions or rashes. Psych:  Alert and cooperative. Normal mood and affect.   Assessment   Katherine Simmons is a 37 y.o. female with a history of migraines and grand mal seizure in 2014 presenting today with constipation.  Constipation, bloating: Suffering from chronic constipation for many years.  Has a diet higher in fiber than most, only drinks water, and exercises daily.  Has only been on phentermine for 1 month which could be contributing to worsening symptoms since she began this.  Has been spending long amounts of time on the commode and having some incomplete emptying.  Also with some difficulty urinating at times which could be contributing to constipation or could be of other unknown etiology.  Her other main complaint is bloating which she has been experiencing for a while now as well and does report frequent flatulence.  Suspect this is primarily due to her constipation but other etiologies include celiac and SIBO.  We will defer lab testing and SIBO testing for now and attempt better control of her constipation first.  Advise she may use over-the-counter anti-gas medication for her bloating.  PLAN   Miralax 17g nightly. Benefiber 2-3 teaspoons daily.  Phazyme or gas ex as needed for bloating Avoid common gas producing foods.  Consider SIBO testing, celiac labs and CBC, CMP, TSH if no improvement by follow up. Follow up in 3 months.    Brooke Bonito, MSN, FNP-BC, AGACNP-BC [facility]

## 2023-02-13 ENCOUNTER — Ambulatory Visit: Payer: BC Managed Care – PPO | Admitting: Gastroenterology

## 2023-05-30 LAB — COMPREHENSIVE METABOLIC PANEL: EGFR: 85

## 2023-07-05 ENCOUNTER — Ambulatory Visit: Payer: BC Managed Care – PPO | Admitting: Plastic Surgery

## 2023-07-05 ENCOUNTER — Encounter: Payer: Self-pay | Admitting: Plastic Surgery

## 2023-07-05 VITALS — BP 119/81 | HR 92 | Ht 65.0 in | Wt 254.4 lb

## 2023-07-05 DIAGNOSIS — E65 Localized adiposity: Secondary | ICD-10-CM | POA: Diagnosis not present

## 2023-07-05 DIAGNOSIS — R21 Rash and other nonspecific skin eruption: Secondary | ICD-10-CM

## 2023-07-05 DIAGNOSIS — Z6841 Body Mass Index (BMI) 40.0 and over, adult: Secondary | ICD-10-CM | POA: Diagnosis not present

## 2023-07-05 NOTE — Progress Notes (Signed)
Referring Provider Health, [facility] [address] [place],  [place] [address]   CC:  Chief Complaint  Patient presents with   Advice Only      Katherine Simmons is an 38 y.o. female.  HPI: Katherine Simmons is a very nice 38 year old female who presents today for evaluation for possible panniculectomy.  She has been having rashes on the posterior aspect of her pannus and has lobe back pain.  She has lost by her report approximately 125 pounds.  She is interested in a panniculectomy.  No Known Allergies  Outpatient Encounter Medications as of 07/05/2023  Medication Sig   ibuprofen (ADVIL,MOTRIN) 800 MG tablet Take 800 mg by mouth every 8 (eight) hours as needed.   levETIRAcetam (KEPPRA XR) 500 MG 24 hr tablet Take 1,000 mg by mouth daily.   phentermine (ADIPEX-P) 37.5 MG tablet Take 37.5 mg by mouth every morning.   No facility-administered encounter medications on file as of 07/05/2023.     Past Medical History:  Diagnosis Date   Breast hypertrophy    Ectopic pregnancy    Epilepsy, grand mal (HCC) last 01/2013   Headache(784.0)    "monthly" (10/27/2015)   Infection    UTI   Migraine    "a few/year" (10/27/2015)   Scabies     Past Surgical History:  Procedure Laterality Date   BREAST REDUCTION SURGERY Bilateral 10/27/2015   Procedure: MAMMARY REDUCTION  (BREAST) WITH FREE NIPPLE GRAFTS;  Surgeon: Etter Sjogren, MD;  Location: [facility] OR;  Service: Plastics;  Laterality: Bilateral;   LAPAROSCOPIC UNILATERAL SALPINGECTOMY Right 2013   For ectopic pregnancy   LAPAROSCOPIC UNILATERAL SALPINGECTOMY Left 03/19/2013   Procedure: LAPAROSCOPIC LEFT SALPINGECTOMY;  Surgeon: Tilda Burrow, MD;  Location: [facility] ORS;  Service: Gynecology;  Laterality: Left;   REDUCTION MAMMAPLASTY  10/27/2015   with free nipple graft technique   THERAPEUTIC ABORTION  ~ 2002    Family History  Problem Relation Age of Onset   Asthma Sister    Hypertension Maternal Aunt    Cancer Maternal Grandmother         lung   Hearing loss Neg Hx     Social History   Social History Narrative   Not on file     Review of Systems General: Denies fevers, chills, weight loss CV: Denies chest pain, shortness of breath, palpitations Abdomen: Patient complains of excess skin on the anterior abdominal wall.  Physical Exam    07/05/2023    1:45 PM 11/14/2022    8:07 AM 09/29/2017   10:00 AM  Vitals with BMI  Height 5\' 5"  5\' 5"  5\' 5"   Weight 254 lbs 6 oz 267 lbs 3 oz 338 lbs  BMI 42.33 44.46 56.25  Systolic 119 109 528  Diastolic 81 77 70  Pulse 92 83 109    General:  No acute distress,  Alert and oriented, Non-Toxic, Normal speech and affect Abdomen: Patient has excess skin and fat on the anterior abdominal wall with significant protuberance of the abdominal wall. Mammogram: Not applicable Assessment/Plan Localized adiposity of anterior abdominal wall, obesity: Discussed panniculectomy with the patient.  We specifically discussed the increased risk of wound healing and medical complications in patients with a BMI as high as hers.  She should be congratulated on her significant weight loss but she still has some weight to lose before I believe that her panniculectomy can be done safely.  Additionally the more weight she loses the better the aesthetic outcome from  the procedure.  Discussed all of this at length.  And all her questions were answered.  She very clearly understands that she also needs to stop smoking as surgery cannot be performed until he is nicotine free.  Please consult to healthy weight and wellness.  She may follow-up once her weight has dropped to a BMI below 40.  Santiago Glad 07/05/2023, 2:50 PM

## 2023-08-09 ENCOUNTER — Encounter (INDEPENDENT_AMBULATORY_CARE_PROVIDER_SITE_OTHER): Payer: Self-pay

## 2023-08-24 ENCOUNTER — Encounter (INDEPENDENT_AMBULATORY_CARE_PROVIDER_SITE_OTHER): Payer: Self-pay

## 2023-09-28 ENCOUNTER — Ambulatory Visit (INDEPENDENT_AMBULATORY_CARE_PROVIDER_SITE_OTHER): Payer: Self-pay | Admitting: Family Medicine

## 2023-09-28 ENCOUNTER — Encounter (INDEPENDENT_AMBULATORY_CARE_PROVIDER_SITE_OTHER): Payer: Self-pay | Admitting: Family Medicine

## 2023-09-28 VITALS — BP 112/79 | HR 77 | Temp 98.5°F | Ht 65.0 in | Wt 252.0 lb

## 2023-09-28 DIAGNOSIS — Z6841 Body Mass Index (BMI) 40.0 and over, adult: Secondary | ICD-10-CM

## 2023-09-28 DIAGNOSIS — R7303 Prediabetes: Secondary | ICD-10-CM

## 2023-09-28 DIAGNOSIS — G40802 Other epilepsy, not intractable, without status epilepticus: Secondary | ICD-10-CM | POA: Diagnosis not present

## 2023-09-28 DIAGNOSIS — E7849 Other hyperlipidemia: Secondary | ICD-10-CM | POA: Diagnosis not present

## 2023-09-28 NOTE — Progress Notes (Signed)
 Rae Bugler, DO, ABFM, ABOM Bariatric physician [address] [place], [place], [place] [address] Office: [phone]  /  Fax: [phone]    Initial Evaluation:  Katherine Simmons was seen in clinic today to evaluate for obesity. She is interested in losing weight to improve overall health and reduce the risk of weight related complications. She presents today to review program treatment options, initial physical assessment, and evaluation.     She was referred by: Dr.Taylor of Plastic Surgery.   When asked how has your weight affected you? She states: Contributed to medical problems, Problems with eating patterns, and Has affected mood   Contributing factors to her weight change: Moderate to high levels of stress, Reduced physical activity, Eating patterns, nutritional, and life events.  Some associated conditions: Hypertension, Hyperlipidemia, OSA on CPAP (since 2021), and Prediabetes  Current nutrition plan: Intermittent fasting - eats 1 meal a day.   Current level of physical activity: cardio + strength training 30-50 minutes daily.   Previous pharmacotherapy: Was on Phentermine last year.  Response to medication:  lost 15 lbs (kept off weight), however stopped medication d/t loss of efficacy   Past Medical History:  Diagnosis Date   Breast hypertrophy    Ectopic pregnancy    Epilepsy, grand mal (HCC) last 01/2013   Headache(784.0)    "monthly" (10/27/2015)   Infection    UTI   Migraine    "a few/year" (10/27/2015)   Scabies     Current Outpatient Medications  Medication Instructions   ibuprofen  (ADVIL ) 800 mg, Every 8 hours PRN   levETIRAcetam  (KEPPRA  XR) 1,000 mg, Daily   phentermine (ADIPEX-P) 37.5 mg, Every morning    No Known Allergies   Past Surgical History:  Procedure Laterality Date   BREAST REDUCTION SURGERY Bilateral 10/27/2015   Procedure: MAMMARY REDUCTION  (BREAST) WITH FREE NIPPLE GRAFTS;  Surgeon: Elidia Grout, MD;  Location: [facility] OR;  Service:  Plastics;  Laterality: Bilateral;   LAPAROSCOPIC UNILATERAL SALPINGECTOMY Right 2013   For ectopic pregnancy   LAPAROSCOPIC UNILATERAL SALPINGECTOMY Left 03/19/2013   Procedure: LAPAROSCOPIC LEFT SALPINGECTOMY;  Surgeon: Albino Hum, MD;  Location: [facility]S;  Service: Gynecology;  Laterality: Left;   REDUCTION MAMMAPLASTY  10/27/2015   with free nipple graft technique   THERAPEUTIC ABORTION  ~ 2002     Family History  Problem Relation Age of Onset   Asthma Sister    Hypertension Maternal Aunt    Cancer Maternal Grandmother        lung   Hearing loss Neg Hx      Objective:  BP 112/79   Pulse 77   Temp 98.5 F (36.9 C)   Ht 5\' 5"  (1.651 m)   Wt 252 lb (114.3 kg)   SpO2 99%   BMI 41.93 kg/m  She was weighed on the bioimpedance scale: Body mass index is 41.93 kg/m.  Visceral Fat %: 13 , Body Fat %: 46.6  Weight Lost Since Last Visit: N/a  Weight Gained Since Last Visit: N/a    Vitals Temp: 98.5 F (36.9 C) BP: 112/79 Pulse Rate: 77 SpO2: 99 %   Anthropometric Measurements Height: 5\' 5"  (1.651 m) Weight: 252 lb (114.3 kg) BMI (Calculated): 41.93 Weight at Last Visit: N/a Weight Lost Since Last Visit: N/a Weight Gained Since Last Visit: N/a Starting Weight: N/a Total Weight Loss (lbs): 0 lb (0 kg) Waist Measurement : 50 inches   Body Composition  Body Fat %: 46.6 % Fat Mass (lbs): 117.8 lbs  Muscle Mass (lbs): 128 lbs Total Body Water (lbs): 93.8 lbs Visceral Fat Rating : 13   Other Clinical Data Fasting: No Labs: No Comments: Info session    General: Well Developed, well nourished, and in no acute distress.  HEENT: Normocephalic, atraumatic; EOMI, sclerae are anicteric. Skin: Warm and dry, good turgor Chest:  Normal excursion, shape, no gross ABN Respiratory: No conversational dyspnea; speaking in full sentences NeuroM-Sk:  Normal gross ROM * 4 extremities  Psych: A and O *3, insight adequate, mood- full   Assessment and Plan:   FOR THE  DISEASE OF OBESITY:  Morbid obesity (HCC) Assessment & Plan: We reviewed anthropometrics, biometrics, associated medical conditions and contributing factors with patient. She would benefit from a medically tailored reduced calorie nutrional plan based on her REE (resting energy expenditure), which will be determined by indirect calorimetry.  We will also assess for cardiometabolic risk and nutritional derangements via fasting labs at intake appointment.    Obesity Treatment / Action Plan:   she was weighed on the bioimpedance scale and results were discussed and documented in the synopsis.   [facility] will complete provided nutritional and psychosocial assessment questionnaire before the next appointment.  she will be scheduled for indirect calorimetry to determine resting energy expenditure in a fasting state.  This will allow us  to create a reduced calorie, high-protein meal plan to promote loss of fat mass while preserving muscle mass.  We will also assess for cardiometabolic risk and nutritional derangements via an ECG and fasting serologies at her next appointment.  she was encouraged to work on amassing support from family and friends to begin their weight loss journey.   Work on eliminating or reducing the presence of highly processed, poorly nutritious, calorie-dense foods in the home.   Obesity Education Performed Today:  Patient was counseled on nutritional approaches to weight loss and benefits of reducing processed foods and consuming plant-based foods and high quality protein as part of nutritional weight management program.   We discussed the importance of long term lifestyle changes which include nutrition, exercise and behavioral modifications as well as the importance of customizing this to her specific health and social needs.   We discussed the benefits of reaching a healthier weight to alleviate the symptoms of existing conditions and reduce the risks of the  biomechanical, metabolic and psychological effects of obesity.  Was counseled on the health benefits of losing 5%-10% of total body weight.  Was counseled on our cognitive behavorial therapy program, lead by our bariatric psychologist, who focuses on emotional eating and creating positive behavorial change.  Was counseled on bariatric pharmacotherapy and how this may be used as an adjunct in their weight management   Ishia appears to be in the action stage of change and states they are ready to start intensive lifestyle modifications and behavioral modifications.  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 On a regimen of Keppra  XR 1,000 mg daily. States this condition is well controlled - her last seizure was in 2014. Continue regimen  per neurology and f/up with them as directed.    Prediabetes Assessment & Plan: Lab Results  Component Value Date   HGBA1C 6.2 (H) 09/29/2017   HGBA1C  5.9 (H) 01/18/2013    Diet/lifestyle approach. Most recent Hemoglobin A1c above is consistent with pre-dm. Pre-dm may contribute to abnormal cravings, fatigue and diabetic complications without having diabetes. If she decides to join our program, we will check fasting blood glucose, A1c, and insulin  levels with intake labs at next OV. She would benefit from a high protein low-carb meal plan.    Other hyperlipidemia Assessment & Plan: Lab Results  Component Value Date   CHOL 194 09/29/2017   HDL 44 09/29/2017   LDLCALC 124 (H) 09/29/2017   TRIG 131 09/29/2017   CHOLHDL 4.4 09/29/2017   Diet/lifestyle approach. Most recent lipid panel above showed an elevated LDL of 124. Elevated LDL may be secondary to nutrition,  genetics and spillover effect from excess adiposity. She would benefit from a diet reduced in saturated and trans fats. Losing 10% of body weight may improve LDL values.    Attestations:   I, Special Puri, acting as a Stage manager for [employer], DO., have compiled all relevant documentation for today's office visit on behalf of Marceil Sensor, DO, while in the presence of [employer], DO.  I have spent 40 minutes in the care of the patient today. Specifically:  5 minutes was spent before the visit reviewing the chart. 30 minutes was spent counseling patient on the disease of obesity and what our program can do for their medical conditions as well as in preventing future diseases. I discussed the importance of comprehensive care in the treatment of obesity including mental well being and physical activity. This was all in addition to the above counseling. 5 minutes was spent after the visit on additional documentation and chart review.    I have reviewed the above documentation for accuracy and completeness, and I agree with the above. Rae Bugler, D.O.  The 21st Century Cures Act was signed into law in 2016 which includes the topic of electronic health records.  This provides immediate access to information in MyChart.  This includes consultation notes, operative notes, office notes, lab results and pathology reports.  If you have any questions about what you read please let us  know at your next visit so we can discuss your concerns and take corrective action if need be.  We are right here with you!
